# Patient Record
Sex: Female | Born: 1935
Health system: Southern US, Community
[De-identification: ages and names within clinical notes are randomized; demographics above are authoritative.]

## PROBLEM LIST (undated history)

## (undated) DIAGNOSIS — Z9889 Other specified postprocedural states: Secondary | ICD-10-CM

## (undated) DIAGNOSIS — I2510 Atherosclerotic heart disease of native coronary artery without angina pectoris: Secondary | ICD-10-CM

## (undated) DIAGNOSIS — I10 Essential (primary) hypertension: Secondary | ICD-10-CM

## (undated) DIAGNOSIS — E785 Hyperlipidemia, unspecified: Secondary | ICD-10-CM

## (undated) HISTORY — DX: Atherosclerotic heart disease of native coronary artery without angina pectoris: I25.10

## (undated) HISTORY — DX: Other specified postprocedural states: Z98.890

## (undated) HISTORY — PX: CORONARY ANGIOPLASTY WITH STENT PLACEMENT: SHX49

## (undated) HISTORY — DX: Hyperlipidemia, unspecified: E78.5

## (undated) HISTORY — DX: Essential (primary) hypertension: I10

## (undated) HISTORY — PX: CAROTID ENDARTERECTOMY: SUR193

---

## 1997-12-08 ENCOUNTER — Other Ambulatory Visit: Admission: RE | Admit: 1997-12-08 | Discharge: 1997-12-08 | Payer: Self-pay | Admitting: Family Medicine

## 1998-05-29 ENCOUNTER — Ambulatory Visit (HOSPITAL_COMMUNITY): Admission: RE | Admit: 1998-05-29 | Discharge: 1998-05-29 | Payer: Self-pay | Admitting: Urology

## 1998-07-04 ENCOUNTER — Ambulatory Visit (HOSPITAL_COMMUNITY): Admission: RE | Admit: 1998-07-04 | Discharge: 1998-07-04 | Payer: Self-pay | Admitting: Family Medicine

## 1998-07-04 ENCOUNTER — Encounter: Payer: Self-pay | Admitting: Family Medicine

## 1999-08-03 ENCOUNTER — Encounter (INDEPENDENT_AMBULATORY_CARE_PROVIDER_SITE_OTHER): Payer: Self-pay | Admitting: Specialist

## 1999-08-03 ENCOUNTER — Ambulatory Visit (HOSPITAL_COMMUNITY): Admission: RE | Admit: 1999-08-03 | Discharge: 1999-08-03 | Payer: Self-pay | Admitting: Urology

## 1999-12-09 ENCOUNTER — Other Ambulatory Visit: Admission: RE | Admit: 1999-12-09 | Discharge: 1999-12-09 | Payer: Self-pay | Admitting: Family Medicine

## 2000-07-05 ENCOUNTER — Encounter: Payer: Self-pay | Admitting: Emergency Medicine

## 2000-07-05 ENCOUNTER — Emergency Department (HOSPITAL_COMMUNITY): Admission: EM | Admit: 2000-07-05 | Discharge: 2000-07-05 | Payer: Self-pay | Admitting: Emergency Medicine

## 2000-07-21 ENCOUNTER — Ambulatory Visit (HOSPITAL_COMMUNITY): Admission: RE | Admit: 2000-07-21 | Discharge: 2000-07-22 | Payer: Self-pay | Admitting: Cardiovascular Disease

## 2000-08-28 ENCOUNTER — Other Ambulatory Visit: Admission: RE | Admit: 2000-08-28 | Discharge: 2000-08-28 | Payer: Self-pay | Admitting: Urology

## 2001-12-17 ENCOUNTER — Other Ambulatory Visit: Admission: RE | Admit: 2001-12-17 | Discharge: 2001-12-17 | Payer: Self-pay | Admitting: Family Medicine

## 2002-04-18 ENCOUNTER — Ambulatory Visit (HOSPITAL_COMMUNITY): Admission: RE | Admit: 2002-04-18 | Discharge: 2002-04-18 | Payer: Self-pay | Admitting: *Deleted

## 2004-02-02 ENCOUNTER — Encounter: Admission: RE | Admit: 2004-02-02 | Discharge: 2004-02-02 | Payer: Self-pay | Admitting: Emergency Medicine

## 2004-02-11 ENCOUNTER — Other Ambulatory Visit: Admission: RE | Admit: 2004-02-11 | Discharge: 2004-02-11 | Payer: Self-pay | Admitting: Family Medicine

## 2004-10-13 ENCOUNTER — Encounter: Admission: RE | Admit: 2004-10-13 | Discharge: 2004-10-13 | Payer: Self-pay | Admitting: Family Medicine

## 2005-02-10 LAB — TSH: TSH: 1.33 u[IU]/mL (ref <=5.90)

## 2006-09-29 ENCOUNTER — Ambulatory Visit: Payer: Self-pay | Admitting: Vascular Surgery

## 2006-10-24 ENCOUNTER — Encounter: Payer: Self-pay | Admitting: Neurosurgery

## 2006-11-01 ENCOUNTER — Other Ambulatory Visit: Admission: RE | Admit: 2006-11-01 | Discharge: 2006-11-01 | Payer: Self-pay | Admitting: Family Medicine

## 2006-11-21 ENCOUNTER — Inpatient Hospital Stay (HOSPITAL_COMMUNITY): Admission: RE | Admit: 2006-11-21 | Discharge: 2006-11-22 | Payer: Self-pay | Admitting: Interventional Radiology

## 2007-03-30 ENCOUNTER — Ambulatory Visit (HOSPITAL_COMMUNITY): Admission: RE | Admit: 2007-03-30 | Discharge: 2007-03-30 | Payer: Self-pay | Admitting: Interventional Radiology

## 2007-11-26 ENCOUNTER — Ambulatory Visit (HOSPITAL_COMMUNITY): Admission: RE | Admit: 2007-11-26 | Discharge: 2007-11-26 | Payer: Self-pay | Admitting: Interventional Radiology

## 2008-07-01 ENCOUNTER — Encounter: Payer: Self-pay | Admitting: Interventional Radiology

## 2008-07-04 ENCOUNTER — Ambulatory Visit (HOSPITAL_COMMUNITY): Admission: RE | Admit: 2008-07-04 | Discharge: 2008-07-04 | Payer: Self-pay | Admitting: Interventional Radiology

## 2009-05-04 ENCOUNTER — Ambulatory Visit (HOSPITAL_COMMUNITY): Admission: RE | Admit: 2009-05-04 | Discharge: 2009-05-04 | Payer: Self-pay | Admitting: Interventional Radiology

## 2009-08-11 ENCOUNTER — Ambulatory Visit: Payer: Self-pay | Admitting: Cardiovascular Disease

## 2009-08-11 LAB — BASIC METABOLIC PANEL
BUN: 20 mg/dL (ref 4–21)
Creatinine: 0.7 mg/dL (ref <=1.1)

## 2009-08-11 LAB — LIPID PANEL
Cholesterol: 131 mg/dL (ref 0–200)
HDL: 57 mg/dL (ref 35–70)
LDL Cholesterol: 58 mg/dL
Triglycerides: 80 mg/dL (ref 40–160)

## 2010-01-20 ENCOUNTER — Encounter: Payer: Self-pay | Admitting: Cardiovascular Disease

## 2010-01-20 DIAGNOSIS — Z9889 Other specified postprocedural states: Secondary | ICD-10-CM | POA: Insufficient documentation

## 2010-01-20 DIAGNOSIS — I2510 Atherosclerotic heart disease of native coronary artery without angina pectoris: Secondary | ICD-10-CM | POA: Insufficient documentation

## 2010-01-20 DIAGNOSIS — I10 Essential (primary) hypertension: Secondary | ICD-10-CM | POA: Insufficient documentation

## 2010-01-20 DIAGNOSIS — E785 Hyperlipidemia, unspecified: Secondary | ICD-10-CM | POA: Insufficient documentation

## 2010-01-24 ENCOUNTER — Encounter: Payer: Self-pay | Admitting: Interventional Radiology

## 2010-02-10 ENCOUNTER — Encounter: Payer: Self-pay | Admitting: Cardiovascular Disease

## 2010-02-10 ENCOUNTER — Ambulatory Visit (INDEPENDENT_AMBULATORY_CARE_PROVIDER_SITE_OTHER): Payer: Medicare Other | Admitting: Cardiovascular Disease

## 2010-02-10 VITALS — BP 140/60 | Wt 146.0 lb

## 2010-02-10 DIAGNOSIS — Z9861 Coronary angioplasty status: Secondary | ICD-10-CM

## 2010-02-10 DIAGNOSIS — E785 Hyperlipidemia, unspecified: Secondary | ICD-10-CM

## 2010-02-10 DIAGNOSIS — I119 Hypertensive heart disease without heart failure: Secondary | ICD-10-CM

## 2010-02-10 DIAGNOSIS — I10 Essential (primary) hypertension: Secondary | ICD-10-CM

## 2010-02-10 DIAGNOSIS — I2510 Atherosclerotic heart disease of native coronary artery without angina pectoris: Secondary | ICD-10-CM

## 2010-02-10 MED ORDER — NEBIVOLOL HCL 5 MG PO TABS: 5.0000 mg | ORAL_TABLET | Freq: Every day | ORAL | Status: DC

## 2010-02-10 NOTE — Progress Notes (Signed)
Patient Active Problem List  Diagnoses  . Coronary artery disease  . Hypertension  . Hyperlipemia  . S/P carotid endarterectomy    Current Outpatient Prescriptions on File Prior to Visit  Medication Sig Dispense Refill  . alendronate (FOSAMAX) 70 MG tablet Take 70 mg by mouth every 7 (seven) days. Take in the morning with a full glass of water, on an empty stomach, and do not take anything else by mouth or lie down for the next 30 min.       Marland Kitchen amLODipine (NORVASC) 5 MG tablet Take 5 mg by mouth daily.        Marland Kitchen aspirin 325 MG EC tablet Take 325 mg by mouth daily.        . hydrochlorothiazide 25 MG tablet Take 25 mg by mouth daily.        Marland Kitchen lisinopril (PRINIVIL,ZESTRIL) 40 MG tablet Take 40 mg by mouth daily.        . potassium chloride (KLOR-CON) 10 MEQ CR tablet Take 10 mEq by mouth daily.        . rosuvastatin (CRESTOR) 10 MG tablet Take 10 mg by mouth daily.          Allergies  Allergen Reactions  . Codeine     [name] is an elderly female in no acute distress. The patient is alert and oriented x 3.  The mood and affect are normal.  Wt. is 146.  BP is 140/60.  HR is 80.  The HEENT exam reveals that the mucous membranes are moist.  The carotids are normal without bruits.  She has a right carotid endarterectomy scar There is no thyromegaly or JVD.  The lungs are clear.  The chest wall is non tender.  The heart exam reveals a regular rate with a normal S1 and S2.  There are no murmurs and the PMI is not displaced.  Abdominal exam reveals good bowel sounds.   There is no hepatosplenomegaly or tenderness.  There are no masses.  Exam of the legs reveal no clubbing, cyanosis, or edema.  The legs are without rashes.  The distal pulses are intact.  Cranial nerves II - XII are intact.  Motor and sensory functions are intact.  The gait is normal.

## 2010-02-10 NOTE — Patient Instructions (Signed)
She is to call if she has any further problems with her blood pressure.

## 2010-02-10 NOTE — Assessment & Plan Note (Signed)
She is very stable from a coronary standpoint. She has not had any episodes of angina

## 2010-02-10 NOTE — Assessment & Plan Note (Signed)
Her blood pressure is mildly elevated today. We will add Bystolic 5 mg a day. I've given her samples. I'll see her again in one to 2 months for followup visit.

## 2010-02-10 NOTE — Assessment & Plan Note (Signed)
Her lipid levels have been stable. We'll check her lipid profile and a CMET at her next visit.

## 2010-02-11 ENCOUNTER — Other Ambulatory Visit (INDEPENDENT_AMBULATORY_CARE_PROVIDER_SITE_OTHER): Payer: Medicare Other

## 2010-02-11 DIAGNOSIS — E7800 Pure hypercholesterolemia, unspecified: Secondary | ICD-10-CM

## 2010-02-11 DIAGNOSIS — Z79899 Other long term (current) drug therapy: Secondary | ICD-10-CM

## 2010-03-23 LAB — CREATININE, SERUM
Creatinine, Ser: 0.71 mg/dL (ref 0.4–1.2)
GFR calc Af Amer: >60 mL/min (ref >60)
GFR calc non Af Amer: >60 mL/min (ref >60)

## 2010-03-23 LAB — BUN: BUN: 21 mg/dL (ref 6–23)

## 2010-04-02 ENCOUNTER — Ambulatory Visit (INDEPENDENT_AMBULATORY_CARE_PROVIDER_SITE_OTHER): Payer: Medicare Other | Admitting: Cardiovascular Disease

## 2010-04-02 ENCOUNTER — Encounter: Payer: Self-pay | Admitting: Cardiovascular Disease

## 2010-04-02 VITALS — BP 134/56 | HR 80 | Wt 148.0 lb

## 2010-04-02 DIAGNOSIS — E785 Hyperlipidemia, unspecified: Secondary | ICD-10-CM

## 2010-04-02 DIAGNOSIS — T148XXA Other injury of unspecified body region, initial encounter: Secondary | ICD-10-CM | POA: Insufficient documentation

## 2010-04-02 DIAGNOSIS — I10 Essential (primary) hypertension: Secondary | ICD-10-CM

## 2010-04-02 LAB — CBC WITH DIFFERENTIAL/PLATELET
Basophils Absolute: 0 10*3/uL (ref 0.0–0.1)
Basophils Relative: 0.4 % (ref 0.0–3.0)
Eosinophils Absolute: 0.1 10*3/uL (ref 0.0–0.7)
Eosinophils Relative: 1.6 % (ref 0.0–5.0)
HCT: 38.8 % (ref 36.0–46.0)
Hemoglobin: 13.4 g/dL (ref 12.0–15.0)
Lymphocytes Relative: 13.3 % (ref 12.0–46.0)
Lymphs Abs: 1.2 10*3/uL (ref 0.7–4.0)
MCHC: 34.5 g/dL (ref 30.0–36.0)
MCV: 91.4 fl (ref 78.0–100.0)
Monocytes Absolute: 0.6 10*3/uL (ref 0.1–1.0)
Monocytes Relative: 6 % (ref 3.0–12.0)
Neutro Abs: 7.3 10*3/uL (ref 1.4–7.7)
Neutrophils Relative %: 78.7 % — ABNORMAL HIGH (ref 43.0–77.0)
Platelets: 204 10*3/uL (ref 150.0–400.0)
RBC: 4.25 Mil/uL (ref 3.87–5.11)
RDW: 13.1 % (ref 11.5–14.6)
WBC: 9.3 10*3/uL (ref 4.5–10.5)

## 2010-04-02 NOTE — Assessment & Plan Note (Signed)
She's complaining of bruising. She also has a conjunctival hemorrhage of the right eye. We'll check a CBC with differential today.

## 2010-04-02 NOTE — Assessment & Plan Note (Signed)
[name] seems to be doing very well. Her blood pressure is still very well controlled. She'll continue to stay off the Bystolic if suspect that she has an allergy to that medication. We'll continue to follow this closely.

## 2010-04-02 NOTE — Progress Notes (Signed)
History of Present Illness: [name] is doing very well. She has not had any episodes of chest pain or shortness of breath.Her blood pressure  was elevated when I last saw him to start her on Bystolic. She developed mouth ulcers and mouth sores so the Bystolic was discontinued.  Current Outpatient Prescriptions on File Prior to Visit  Medication Sig Dispense Refill  . alendronate (FOSAMAX) 70 MG tablet Take 70 mg by mouth every 7 (seven) days. Take in the morning with a full glass of water, on an empty stomach, and do not take anything else by mouth or lie down for the next 30 min.       Marland Kitchen amLODipine (NORVASC) 5 MG tablet Take 5 mg by mouth daily.        Marland Kitchen aspirin 325 MG EC tablet Take 325 mg by mouth daily.        . hydrochlorothiazide 25 MG tablet Take 25 mg by mouth daily.        Marland Kitchen lisinopril (PRINIVIL,ZESTRIL) 40 MG tablet Take 40 mg by mouth daily.        . potassium chloride (KLOR-CON) 10 MEQ CR tablet Take 10 mEq by mouth daily.        . rosuvastatin (CRESTOR) 10 MG tablet Take 10 mg by mouth daily.        Marland Kitchen DISCONTD: nebivolol (BYSTOLIC) 5 MG tablet Take 1 tablet (5 mg total) by mouth daily.  30 tablet  12    Allergies  Allergen Reactions  . Codeine     Past Medical History  Diagnosis Date  . Coronary artery disease   . Hypertension   . Hyperlipemia   . S/P carotid endarterectomy     RIGHT CAROTID    Past Surgical History  Procedure Date  . Carotid endarterectomy   . Coronary angioplasty with stent placement     FIRST DIAGONAL    History  Smoking status  . Never Smoker   Smokeless tobacco  . Not on file    History  Alcohol Use No    Family History  Problem Relation Age of Onset  . Stroke Father     DECEASED  . Stroke Mother     DECEASED  . Hypertension Mother     DECEASED  . Stroke Brother     Reviw of Systems:  The patient denies any heat or cold intolerance.  No weight gain or weight loss.  The patient denies headaches or blurry vision.  There is no  cough or sputum production.  The patient denies dizziness.  There is no hematuria or hematochezia.  The patient denies any muscle aches or arthritis.  The patient denies any rash.  The patient denies frequent falling or instability.  There is no history of depression or anxiety.  All other systems were reviewed and are negative.  Physical Exam: BP 134/56  Pulse 80  Wt 148 lb (67.132 kg) The patient is alert and oriented x 3.  The mood and affect are normal.  The skin is warm and dry.  Color is normal.  The HEENT exam reveals that She has a slight conjunctival hemorrhage around her right eye..  The mucous membranes are moist.  The carotids are 2+ without bruits.  There is no thyromegaly.  There is no JVD.  The lungs are clear.  The chest wall is non tender.  The heart exam reveals a regular rate with a normal S1 and S2.  There are no murmurs, gallops, or rubs.  The PMI is not displaced.   Abdominal exam reveals good bowel sounds.  There is no guarding or rebound.  There is no hepatosplenomegaly or tenderness.  There are no masses.  Exam of the legs reveal no clubbing, cyanosis, or edema.  The legs are without rashes.  The distal pulses are intact.  Cranial nerves II - XII are intact.  Motor and sensory functions are intact.  The gait is normal.  Assessment / Plan:

## 2010-04-02 NOTE — Assessment & Plan Note (Signed)
We'll check a lipid profile, basic metabolic profile, and hepatic profile will see her again in 6 months.

## 2010-04-06 ENCOUNTER — Telehealth: Payer: Self-pay | Admitting: Cardiovascular Disease

## 2010-04-06 NOTE — Telephone Encounter (Signed)
Normal lab results mailed to pt. Alfonso Ramus RN

## 2010-04-11 LAB — CBC
HCT: 41.7 % (ref 36.0–46.0)
Hemoglobin: 14.3 g/dL (ref 12.0–15.0)
MCHC: 34.3 g/dL (ref 30.0–36.0)
MCV: 90.4 fL (ref 78.0–100.0)
Platelets: 178 10*3/uL (ref 150–400)
RBC: 4.62 MIL/uL (ref 3.87–5.11)
RDW: 12.9 % (ref 11.5–15.5)
WBC: 7 10*3/uL (ref 4.0–10.5)

## 2010-04-11 LAB — BASIC METABOLIC PANEL
BUN: 21 mg/dL (ref 6–23)
CO2: 26 mEq/L (ref 19–32)
Calcium: 9.5 mg/dL (ref 8.4–10.5)
Chloride: 105 mEq/L (ref 96–112)
Creatinine, Ser: 0.59 mg/dL (ref 0.4–1.2)
GFR calc Af Amer: >60 mL/min (ref >60)
GFR calc non Af Amer: >60 mL/min (ref >60)
Glucose, Bld: 95 mg/dL (ref 70–99)
Potassium: 4 mEq/L (ref 3.5–5.1)
Sodium: 139 mEq/L (ref 135–145)

## 2010-04-11 LAB — APTT: aPTT: 35 seconds (ref 24–37)

## 2010-04-11 LAB — PROTIME-INR
INR: 1.1 (ref 0.00–1.49)
Prothrombin Time: 14.3 seconds (ref 11.6–15.2)

## 2010-05-18 NOTE — Procedures (Signed)
CAROTID DUPLEX EXAM   INDICATION:  Follow up known carotid artery disease.   HISTORY:  Diabetes:  No.  Cardiac:  PTCA.  Hypertension:  Yes.  Smoking:  No.  Previous Surgery:  Right carotid endarterectomy in 1994.  CV History:  No.  Amaurosis Fugax No, Paresthesias No, Hemiparesis No                                       RIGHT             LEFT  Brachial systolic pressure:         124               110  Brachial Doppler waveforms:         Biphasic          Biphasic  Vertebral direction of flow:        Antegrade         Antegrade  DUPLEX VELOCITIES (cm/sec)  CCA peak systolic                   96                118  ECA peak systolic                   158               136  ICA peak systolic                   136               106  ICA end diastolic                   40                33  PLAQUE MORPHOLOGY:                  Heterogenous      Heterogenous  PLAQUE AMOUNT:                      Mild              Mild  PLAQUE LOCATION:                    ICA, ECA          ICA, ECA   IMPRESSION:  1. A 1-39% stenosis noted in bilateral internal carotid arteries,      status post right carotid      endarterectomy.  2. Antegrade bilateral vertebral arteries.   ___________________________________________  Larina Earthly, M.D.   MG/MEDQ  D:  09/29/2006  T:  09/30/2006  Job:  [id number]
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
## 2010-05-18 NOTE — H&P (Signed)
NAMEQUINNIE, BARCELO                   ACCOUNT NO.:  [id number]   MEDICAL RECORD NO.:  [id number]          PATIENT TYPE:  INP   LOCATION:  2899                         FACILITY:  [facility]   PHYSICIAN:  Sanjeev K. Deveshwar, M.D.DATE OF BIRTH:  1930-07-12   DATE OF ADMISSION:  11/21/2006  DATE OF DISCHARGE:                              HISTORY & PHYSICAL   CHIEF COMPLAINT:  Cerebral aneurysm.   HISTORY OF PRESENT ILLNESS:  This is a very pleasant, 77 year old female  who was referred to Dr. Corliss Skains through the courtesy of Dr. Trey Sailors  for further evaluation of a cerebral aneurysm. The the patient was  having an odd sensation in her right forehead along with numbness in her  right foot in early September of this year. An MRA was performed on  September 26, 2006 that revealed a 4 x 5 mm aneurysm in the left  anterior cerebral artery. The patient saw Dr. Channing Mutters and then was referred  to Dr. Corliss Skains who saw the patient in consultation on October 24, 2006. Treatment options were discussed including conservative  management, open craniotomy and endovascular treatment. The risks and  benefits of each option were discussed as well. The patient and her  husband decided to proceed with a cerebral angiogram and possible  endovascular treatment. She is admitted to [facility] today  October 24, 2006 for those procedures.   PAST MEDICAL HISTORY:  Significant for hyperlipidemia and hypertension.  She has a history of coronary artery disease followed by Dr. Leodis Sias. She had a cardiac catheterization in July 2002 with a PTA of the  first diagonal. Her ejection fraction was estimated to be 65-70% at that  time. She recently saw Dr. Elease Hashimoto on November 07, 2006 and was cleared to  undergo anesthesia and treatment of the aneurysm.   PAST SURGICAL HISTORY:  Significant for a right carotid endarterectomy  in approximately 1990. She also is status post rotator cuff repair. She  has had bunion  surgery, she denies previous problems with anesthesia.   ALLERGIES:  The patient is intolerant to CODEINE. She does not remember  the reaction.   CURRENT MEDICATIONS:  Aspirin, Plavix which was started 3 days ago in  anticipation of possible stenting, Crestor, amlodipine,  hydrochlorothiazide, potassium, Ecotrin, Xanax, Plavix and lisinopril.   SOCIAL HISTORY:  The patient is married, they have 3 children. One of  their daughters is mentally retarded. The patient lives in [place]  with her husband. She has never smoked. She uses alcohol rarely. She  retired in 2001. 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   FAMILY HISTORY:  Her mother died at age 86 from a stroke. She also had  Parkinson's disease. Her father died at age 19 from a stroke. She had a  brother who survived a ruptured aneurysm. She has a very strong family  history of strokes.   REVIEW OF SYSTEMS:  Completely negative except for recent nausea which  attributes to antibiotics, specifically Cipro which she has been taking  for a urinary tract infection. She is  on day #4 or 5 of her course of  Cipro. She also had a history of arthritis in her knees. The remainder  of the review of systems is negative.   LABORATORY DATA:  INR is 1.1, PTT was 35. Hemoglobin 14.2, hematocrit  42.2, WBC 7.5000. Platelets 247,000, BUN is 14, creatinine 0.86.  Potassium was 4.3, glucose 109. GFR was greater than 60.   PHYSICAL EXAMINATION:  Reveals a pleasant but anxious, 77 year old,  white female in no acute distress.  VITAL SIGNS:  Blood pressure 117/63, pulse 82, respirations 18,  temperature 97.2. Oxygen saturation 95% on room air.  HEENT:  Unremarkable.  NECK:  Revealed no bruits.  HEART:  Revealed regular rate and rhythm without murmur.  LUNGS:  Clear.  ABDOMEN:  Soft, nontender.  EXTREMITIES:  Revealed pulses to be intact without edema.  Her airway was rated at a 1, her ASA scale was a 3.  NEUROLOGIC:  Revealed the  patient to be alert and oriented and following  commands. Cranial nerves II-XII are grossly intact. Sensation is intact  to light touch. Motor strength is 5/5 throughout. Cerebellar testing was  intact.   IMPRESSION:  1. 4 x 5 mm aneurysm of the left anterior cerebral artery by MRA      performed September 26, 2006.  2. History of hyperlipidemia.  3. History of hypertension.  4. History of coronary artery disease with a remote PTA of the first      diagonal performed by Dr. Elease Hashimoto.  5. Ejection fraction estimated to be 65-70%.  6. Status post right carotid endarterectomy in 1990.  7. Intolerance to CODEINE.  8. History of an odd feeling in her right forehead as well as numbness      of her right foot in early September which prompted the MRA.   PLAN:  As noted, the patient will undergo a cerebral angiogram today to  be performed by Dr. Corliss Skains. She will also undergo endovascular  treatment of her cerebral aneurysm with possible coiling and/or stenting  if felt to be safe and indicated. This will be performed under general  anesthesia.      Delton See, P.A.    ______________________________  Grandville Silos. Corliss Skains, M.D.    DR/MEDQ  D:  11/21/2006  T:  11/21/2006  Job:  [id number]   cc:   Payton Doughty, M.D.  Duncan Dull, M.D.  Vesta Mixer, M.D.

## 2010-05-18 NOTE — Consult Note (Signed)
NAMEARICKA, [name]                   ACCOUNT NO.:  [id number]   MEDICAL RECORD NO.:  [id number]          PATIENT TYPE:  OUT   LOCATION:  XRAY                         FACILITY:  [facility]   PHYSICIAN:  Sanjeev K. Deveshwar, M.D.DATE OF BIRTH:  [birth year]   DATE OF CONSULTATION:  07/01/2008  DATE OF DISCHARGE:  07/01/2008                                 CONSULTATION   DATE OF FOLLOWUP VISIT:  July 01, 2008.   CHIEF COMPLAINT:  Status post left pericallosal aneurysm coiling  performed, November 21, 2006.   HISTORY:  This is a very pleasant 77 year old female who was referred to  Dr. Corliss Skains through the courtesy of Dr. Trey Sailors in 2008 for  evaluation of the cerebral aneurysm.  An MRA performed on September 26, 2006, had revealed a 4 x 5-mm aneurysm of the left pericallosal artery.  The patient was seen in consultation by Dr. Corliss Skains in October 2008.  Treatment options were discussed along with potential risks and  benefits.  The patient and her husband made a decision to proceed with  coiling of the aneurysm which was performed on November 21, 2006.  The  patient returns today with some new concerns.  She is accompanied by her  husband.   PAST MEDICAL HISTORY:  Significant for:  1. Hyperlipidemia.  2. Hypertension.  3. Coronary artery disease, followed by Dr. Kristeen Miss.  The      patient had a cardiac catheterization in July 2002.  She had an      angioplasty of the first diagonal.  She had an ejection fraction      noted to be 65-70%.  The patient sees Dr. Elease Hashimoto about every 6      months.   PAST SURGICAL HISTORY:  Significant for right carotid endarterectomy in  the 1990s.  She also has had a rotator cuff repair.  She has had bunion  surgery.  She denies any previous problems with anesthesia.   ALLERGIES:  She is intolerant to CODEINE.   CURRENT MEDICATIONS:  1. Aspirin 325 mg daily.  2. Amlodipine 5 mg daily.  3. Lisinopril 40 mg daily.  4. Crestor 10 mg daily.  5.  Hydrochlorothiazide 25 mg daily.  6. Potassium chloride 10 mEq daily.  7. Alendronate 70 mg each week.  8. Calcium 600 mg daily.   SOCIAL HISTORY:  The patient is married.  She and her husband have 3  children, one daughter is mentally disabled.  The patient lives in  [place] with her husband.  She has never smoked.  She uses alcohol  rarely.  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   FAMILY HISTORY:  Her mother died at age 12 from a stroke.  She also had  Parkinson disease.  Her father died at age 23 from a stroke.  She had a  brother who survived a ruptured aneurysm.  She has a very strong family  history of strokes.   IMPRESSION AND PLAN:  The patient returns today to be seen in followup  by Dr. Corliss Skains.  She had undergone coiling of the left pericallosal  aneurysm on November 21, 2006.  She had a followup angiogram on November 26, 2007.  At which time, her aneurysm appeared to be stable.  Dr.  Corliss Skains did recommend a followup angiogram in 6 months which would  have been May of this year.  However, the angiogram apparently was never  scheduled.   The patient requested a followup visit today for new symptoms.  Approximately 2 weeks ago, she developed a funny feeling in the right  side of her head as well as numbness on the bottom of her foot.  She had  similar symptoms just prior to her aneurysm being diagnosed in 2008.  She is concerned that possibly something has happen to the aneurysm or  perhaps she has developed a new aneurysm.   Dr. Corliss Skains did review the images from the previous coiling with the  patient and her husband.  He felt that there was a very good result from  the coiling procedure.  Since the patient is currently overdue for a  followup angiogram, a decision was made to proceed with a cerebral  angiogram this week to further evaluate the previously coiled aneurysm  and to help evaluate the recent onset of symptoms.  Dr.  Corliss Skains felt  that if the angiogram does not show any new findings, she could consider  having an MRI performed, although he felt an angiogram was the  recommended diagnostic procedure at this time.  We have scheduled her  for this coming Friday at 11:00 a.m.  All of their questions were  answered.  Greater than 15 minutes was spent on this followup visit.      Delton See, P.A.    ______________________________  Grandville Silos. Corliss Skains, M.D.    DR/MEDQ  D:  07/01/2008  T:  07/02/2008  Job:  [id number]   cc:   Duncan Dull, M.D.  Payton Doughty, M.D.
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
## 2010-05-21 NOTE — Discharge Summary (Signed)
[name], [name]                   ACCOUNT NO.:  [id number]   MEDICAL RECORD NO.:  [id number]          PATIENT TYPE:  INP   LOCATION:  3106                         FACILITY:  [facility]   PHYSICIAN:  Sanjeev K. 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   HISTORY OF PRESENT ILLNESS:  This is a pleasant 77 year old female  referred to Dr. Corliss Skains through the courtesy of Dr. Trey Sailors for  evaluation of a cerebral aneurysm. Please see admission history and  physical for full details.  An MRA was performed on September 26, 2006  that revealed a 4 x 5 mm aneurysm in the left anterior cerebral artery.  The patient was seen by Dr. Channing Mutters and referred to Dr. Corliss Skains who saw  the patient in consultation on October 24, 2006 treatment options were  discussed along with potential risks and benefits. Arrangements were  made to admit the patient [facility] on November 21, 2006 for  intervention.   PAST MEDICAL HISTORY:  Significant for hyperlipidemia, hypertension,  coronary artery disease followed by Dr. Kristeen Miss.  The patient had  a cardiac catheterization July 2002 with a PTA of the first diagonal.  Her ejection fraction of 65-70% at that time she saw Dr. Elease Hashimoto in  November 2008 and was cleared undergo anesthesia and treatment of the  aneurysm.   PAST SURGICAL HISTORY:  Significant for right carotid endarterectomy she  believes in 30.  She is also status post  rotator cuff repair.  She  has had bunion surgery.  She denies previous problems with anesthesia.   ALLERGIES:  She is intolerant to CODEINE.   SOCIAL HISTORY:  The patient is married.  They have three children, one  daughter is mentally retarded the patient lives in [place] with her  husband.  She has never smoked.  She  uses alcohol rarely.  She retired  in 2001.  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  She also had  Parkinson's disease.  Her father died at age 60 from stroke.  She had a  brother who survived a ruptured aneurysm.  She has a very strong family  history of strokes.   HOSPITAL COURSE:  As noted this patient was admitted to [facility] on November 21, 2006 for further evaluation and possible  treatment of a cerebral aneurysm.  She did undergo a cerebral angiogram  just after admission.  She was found to have a 5.8 mm x 4.8 mm in the  left pericallosal region. The patient subsequently underwent coiling of  the aneurysm performed by Dr. Corliss Skains under general anesthesia.  There  were no immediate or known complications.  Please see Dr. Fatima Sanger  complete dictated note for full details.   Following the procedure the patient was admitted to the neuro intensive  care unit where she was monitored on IV heparin overnight.  The  following day the heparin was discontinued.  Her groin sheath was  removed.  The patient remained on bedrest for 6 hours.  Her potassium  was noted to be low at 3.3, this was supplemented.  Her hemoglobin was  mildly low at 11.7 with a hematocrit of 33.5.   Following 6 hours of bedrest the patient was ambulated with assistance.  She appeared to be doing fine and arrangements were made to discharge  the family the patient in improved and stable condition.   DISCHARGE MEDICATIONS:  The patient was told to resume all of her  previous home medications which included:  Aspirin 325 mg daily.  Crestor,  amlodipine,  hydrochlorothiazide, potassium supplement,  Xanax  and lisinopril.  Plavix had been started prior to admission in  anticipation of possible stent placement. However, this was discontinued  prior to discharge from the hospital.   The patient was given instructions regarding wound care.   She was told  to stay on her regular diet.  She was not to drive or do anything  strenuous for 2 weeks.  A follow-up angiogram was recommended in  approximately 3 months.  The patient was to see Dr. Corliss Skains on follow-  up December 11, 2006 at 2:30 p.m. she was told to follow up with her  primary care physician, Dr. Shaune Pollack for a repeat potassium level  within several weeks.   FINAL DIAGNOSIS:  1. A 5.8 mm x 4.8 mm left pericallosal aneurysm, status post coiling      performed November 21, 2006 by Dr. Corliss Skains.  2. History of hyperlipidemia.  3. History of hypertension.  4. Coronary artery disease with remote PTA of the first diagonal.  5. Ejection fraction 65-70%.  6. Status post right carotid endarterectomy in 1990.  7. Intolerance to CODEINE.  8. Hypokalemia, supplemented.  9. Mild anemia.      Delton See, P.A.    ______________________________  Grandville Silos. Corliss Skains, M.D.    DR/MEDQ  D:  01/22/2007  T:  01/23/2007  Job:  [id number]   cc:   Payton Doughty, M.D.  Duncan Dull, M.D.  Vesta Mixer, M.D.

## 2010-06-10 ENCOUNTER — Encounter: Payer: Self-pay | Admitting: Cardiovascular Disease

## 2010-06-16 ENCOUNTER — Other Ambulatory Visit (HOSPITAL_COMMUNITY): Payer: Self-pay | Admitting: Interventional Radiology

## 2010-06-16 DIAGNOSIS — I729 Aneurysm of unspecified site: Secondary | ICD-10-CM

## 2010-06-24 ENCOUNTER — Telehealth: Payer: Self-pay | Admitting: Cardiovascular Disease

## 2010-06-24 ENCOUNTER — Other Ambulatory Visit: Payer: Self-pay | Admitting: *Deleted

## 2010-06-24 ENCOUNTER — Ambulatory Visit (HOSPITAL_COMMUNITY)
Admission: RE | Admit: 2010-06-24 | Discharge: 2010-06-24 | Disposition: A | Discharge status: Home / Self Care | Payer: Medicare Other | Source: Ambulatory Visit | Attending: Interventional Radiology | Admitting: Interventional Radiology

## 2010-06-24 DIAGNOSIS — I671 Cerebral aneurysm, nonruptured: Secondary | ICD-10-CM | POA: Insufficient documentation

## 2010-06-24 DIAGNOSIS — I729 Aneurysm of unspecified site: Secondary | ICD-10-CM

## 2010-06-24 DIAGNOSIS — R51 Headache: Secondary | ICD-10-CM | POA: Insufficient documentation

## 2010-06-24 LAB — CREATININE, SERUM
Creatinine, Ser: 0.67 mg/dL (ref 0.50–1.10)
GFR calc Af Amer: >60 mL/min (ref >60)
GFR calc non Af Amer: >60 mL/min (ref >60)

## 2010-06-24 LAB — BUN: BUN: 18 mg/dL (ref 6–23)

## 2010-06-24 MED ORDER — ROSUVASTATIN CALCIUM 20 MG PO TABS: ORAL_TABLET | ORAL | Status: DC

## 2010-06-24 MED ORDER — IOHEXOL 350 MG/ML SOLN
50.0000 mL | Freq: Once | INTRAVENOUS | Status: AC | PRN
  Administered 2010-06-24: 50 mL via INTRAVENOUS

## 2010-06-24 NOTE — Progress Notes (Signed)
Verified pt taking crestor 20mg  daily, samples provided for one month

## 2010-06-24 NOTE — Telephone Encounter (Signed)
Called in wanting to know if we have any samples of Crestor 40mg  that she can have. Please call back. I have pulled the chart.

## 2010-06-30 ENCOUNTER — Other Ambulatory Visit (HOSPITAL_COMMUNITY): Payer: Self-pay | Admitting: Interventional Radiology

## 2010-06-30 DIAGNOSIS — I671 Cerebral aneurysm, nonruptured: Secondary | ICD-10-CM

## 2010-07-27 ENCOUNTER — Ambulatory Visit (HOSPITAL_COMMUNITY): Admission: RE | Admit: 2010-07-27 | Payer: Medicare Other | Source: Ambulatory Visit

## 2010-07-30 ENCOUNTER — Other Ambulatory Visit: Payer: Self-pay | Admitting: Cardiovascular Disease

## 2010-07-30 NOTE — Telephone Encounter (Signed)
Fax received from pharmacy. Refill completed. Jodette Jontavius Rabalais RN  

## 2010-08-02 ENCOUNTER — Telehealth: Payer: Self-pay | Admitting: Cardiovascular Disease

## 2010-08-02 MED ORDER — LISINOPRIL 40 MG PO TABS: 40.0000 mg | ORAL_TABLET | Freq: Every day | ORAL | Status: DC

## 2010-08-02 NOTE — Telephone Encounter (Signed)
Pt called and states [facility] is waiting on Dr. Elease Hashimoto for script for refill on 4 medications, pt is following up due to having only one week's worth left, pt states their fax # is [phone]

## 2010-08-02 NOTE — Telephone Encounter (Signed)
Called requesting refills on amlodipine,HCTZ,KCL and lisinopril. Informed all but lisinopril was refilled on 7/27. Will send Lisinopril in today.

## 2010-08-27 ENCOUNTER — Other Ambulatory Visit: Payer: Self-pay | Admitting: Cardiovascular Disease

## 2010-08-27 NOTE — Telephone Encounter (Signed)
Spoke with Larah's spouse and confirmed crestor strength, reminded we need to set up a fasting lab draw, orders are in echrt, he will remind her, refill completed.Alfonso Ramus RN

## 2010-09-27 LAB — BASIC METABOLIC PANEL
BUN: 19
CO2: 24
Calcium: 9.2
Chloride: 104
Creatinine, Ser: 0.63
GFR calc Af Amer: >60
GFR calc non Af Amer: >60
Glucose, Bld: 105 — ABNORMAL HIGH
Potassium: 3.9
Sodium: 135

## 2010-09-27 LAB — CBC
HCT: 39.9
Hemoglobin: 13.6
MCHC: 34.2
MCV: 87.6
Platelets: 189
RBC: 4.55
RDW: 13.8
WBC: 6.3

## 2010-09-27 LAB — APTT: aPTT: 35

## 2010-09-27 LAB — PROTIME-INR
INR: 1.1
Prothrombin Time: 14.7

## 2010-10-01 ENCOUNTER — Other Ambulatory Visit (INDEPENDENT_AMBULATORY_CARE_PROVIDER_SITE_OTHER): Payer: Medicare Other | Admitting: *Deleted

## 2010-10-01 DIAGNOSIS — E785 Hyperlipidemia, unspecified: Secondary | ICD-10-CM

## 2010-10-01 LAB — LIPID PANEL
Cholesterol: 120 mg/dL (ref 0–200)
HDL: 61.6 mg/dL (ref >39.00)
LDL Cholesterol: 52 mg/dL (ref 0–99)
Total CHOL/HDL Ratio: 2
Triglycerides: 32 mg/dL (ref 0.0–149.0)
VLDL: 6.4 mg/dL (ref 0.0–40.0)

## 2010-10-01 LAB — BASIC METABOLIC PANEL
BUN: 19 mg/dL (ref 6–23)
CO2: 29 mEq/L (ref 19–32)
Calcium: 9.6 mg/dL (ref 8.4–10.5)
Chloride: 105 mEq/L (ref 96–112)
Creatinine, Ser: 0.6 mg/dL (ref 0.4–1.2)
GFR: 100.68 mL/min (ref >60.00)
Glucose, Bld: 96 mg/dL (ref 70–99)
Potassium: 4.9 mEq/L (ref 3.5–5.1)
Sodium: 142 mEq/L (ref 135–145)

## 2010-10-01 LAB — HEPATIC FUNCTION PANEL
ALT: 16 U/L (ref 0–35)
AST: 26 U/L (ref 0–37)
Albumin: 3.8 g/dL (ref 3.5–5.2)
Alkaline Phosphatase: 38 U/L — ABNORMAL LOW (ref 39–117)
Bilirubin, Direct: 0.1 mg/dL (ref 0.0–0.3)
Total Bilirubin: 0.6 mg/dL (ref 0.3–1.2)
Total Protein: 6.9 g/dL (ref 6.0–8.3)

## 2010-10-05 ENCOUNTER — Ambulatory Visit (INDEPENDENT_AMBULATORY_CARE_PROVIDER_SITE_OTHER): Payer: Medicare Other | Admitting: Cardiovascular Disease

## 2010-10-05 ENCOUNTER — Encounter: Payer: Self-pay | Admitting: Cardiovascular Disease

## 2010-10-05 VITALS — BP 99/57 | HR 76 | Ht 65.0 in | Wt 148.0 lb

## 2010-10-05 DIAGNOSIS — I10 Essential (primary) hypertension: Secondary | ICD-10-CM

## 2010-10-05 DIAGNOSIS — E785 Hyperlipidemia, unspecified: Secondary | ICD-10-CM

## 2010-10-05 DIAGNOSIS — I2510 Atherosclerotic heart disease of native coronary artery without angina pectoris: Secondary | ICD-10-CM

## 2010-10-05 LAB — BASIC METABOLIC PANEL
BUN: 8
CO2: 26
Calcium: 8.8
Chloride: 101
Creatinine, Ser: 0.57
GFR calc Af Amer: >60
GFR calc non Af Amer: >60
Glucose, Bld: 102 — ABNORMAL HIGH
Potassium: 3.8
Sodium: 135

## 2010-10-05 LAB — PROTIME-INR
INR: 1.2
Prothrombin Time: 15.7 — ABNORMAL HIGH

## 2010-10-05 LAB — CBC
HCT: 38.9
Hemoglobin: 13.4
MCHC: 34.3
MCV: 91.1
Platelets: 191
RBC: 4.28
RDW: 12.3
WBC: 6.8

## 2010-10-05 NOTE — Assessment & Plan Note (Signed)
Her blood pressure is a little bit low. At this point she's not having any symptoms of orthostatic hypotension. I've asked her to measure her blood pressure at least once a week. If she continues to have episodes of low blood pressure we will decrease her amlodipine or perhaps her HCTZ. For now we'll continue with her same medications.

## 2010-10-05 NOTE — Assessment & Plan Note (Signed)
She's very stable from a coronary standpoint. She's not had any episodes of chest pain or shortness breath.

## 2010-10-05 NOTE — Progress Notes (Signed)
[name] Date of Birth  1930-03-26 [facility] [address]    [address] [place], [place]  [address] [phone]  Fax  [phone]  History of Present Illness:  [name] is a 77 year old female with a history of coronary artery disease. She is  status post PTCA and cutting balloon procedure to her first diagonal artery. She also has a history of hypertension, hyperlipidemia and right carotid endarterectomy.  She denies any episodes of chest pain or shortness of breath.  Current Outpatient Prescriptions on File Prior to Visit  Medication Sig Dispense Refill  . alendronate (FOSAMAX) 70 MG tablet Take 70 mg by mouth every 7 (seven) days. Take in the morning with a full glass of water, on an empty stomach, and do not take anything else by mouth or lie down for the next 30 min.       Marland Kitchen amLODipine (NORVASC) 5 MG tablet TAKE 1 TABLET DAILY  90 tablet  1  . aspirin 325 MG EC tablet Take 325 mg by mouth daily.        . CRESTOR 40 MG tablet TAKE ONE TABLET BY MOUTH EVERY DAY  30 each  5  . hydrochlorothiazide 25 MG tablet TAKE 1 TABLET DAILY  90 tablet  1  . lisinopril (PRINIVIL,ZESTRIL) 40 MG tablet Take 1 tablet (40 mg total) by mouth daily.  90 tablet  3  . potassium chloride (MICRO-K) 10 MEQ CR capsule TAKE 1 CAPSULE DAILY  90 capsule  1    Allergies  Allergen Reactions  . Bystolic (Nebivolol Hcl) Other (See Comments)    Mouth sores  . Codeine     Past Medical History  Diagnosis Date  . Coronary artery disease   . Hypertension   . Hyperlipemia   . S/P carotid endarterectomy     RIGHT CAROTID    Past Surgical History  Procedure Date  . Carotid endarterectomy   . Coronary angioplasty with stent placement     FIRST DIAGONAL    History  Smoking status  . Never Smoker   Smokeless tobacco  . Not on file    History  Alcohol Use No    Family History  Problem Relation Age of Onset  . Stroke Father     DECEASED  . Stroke Mother     DECEASED  . Hypertension  Mother     DECEASED  . Stroke Brother     Reviw of Systems:  Reviewed in the HPI.  All other systems are negative.  Physical Exam: BP 99/57  Pulse 76  Ht 5\' 5"  (1.651 m)  Wt 148 lb (67.132 kg)  BMI 24.63 kg/m2 The patient is alert and oriented x 3.  The mood and affect are normal.   Skin: warm and dry.  Color is normal.    HEENT:   the sclera are nonicteric.  The mucous membranes are moist.  The carotids are 2+ without bruits.  There is no thyromegaly.  There is no JVD.    Lungs: clear.  The chest wall is non tender.    Heart: regular rate with a normal S1 and S2.  There are no murmurs, gallops, or rubs. The PMI is not displaced.     Abdomen: good bowel sounds.  There is no guarding or rebound.  There is no hepatosplenomegaly or tenderness.  There are no masses.   Extremities:  no clubbing, cyanosis, or edema.  The legs are without rashes.  The distal pulses are intact.  Neuro:  Cranial nerves II - XII are intact.  Motor and sensory functions are intact.    The gait is normal.   Assessment / Plan:

## 2010-10-05 NOTE — Patient Instructions (Addendum)
Your physician wants you to follow-up in:6 months You will receive a reminder letter in the mail two months in advance. If you don't receive a letter, please call our office to schedule the follow-up appointment.  Your physician recommends that you continue on your current medications as directed. Please refer to the Current Medication list given to you today.  Your physician recommends that you return for lab work in: 6 months/ fasting lipids, bmet/ hfp

## 2010-10-12 LAB — APTT
aPTT: 179 — ABNORMAL HIGH
aPTT: 75 — ABNORMAL HIGH
aPTT: 35

## 2010-10-12 LAB — DIFFERENTIAL
Basophils Absolute: 0
Basophils Relative: 0
Eosinophils Absolute: 0.1 — ABNORMAL LOW
Eosinophils Relative: 2
Lymphocytes Relative: 19
Lymphs Abs: 1.4
Monocytes Absolute: 0.7
Monocytes Relative: 9
Neutro Abs: 5.3
Neutrophils Relative %: 70

## 2010-10-12 LAB — BASIC METABOLIC PANEL
BUN: 8
BUN: 14
CO2: 23
CO2: 26
Calcium: 8.4
Calcium: 9.4
Chloride: 109
Chloride: 103
Creatinine, Ser: 0.62
Creatinine, Ser: 0.86
GFR calc Af Amer: >60
GFR calc Af Amer: >60
GFR calc non Af Amer: >60
GFR calc non Af Amer: >60
Glucose, Bld: 105 — ABNORMAL HIGH
Glucose, Bld: 109 — ABNORMAL HIGH
Potassium: 3.3 — ABNORMAL LOW
Potassium: 4.3
Sodium: 137
Sodium: 137

## 2010-10-12 LAB — CBC
HCT: 33.5 — ABNORMAL LOW
HCT: 42.2
Hemoglobin: 11.6 — ABNORMAL LOW
Hemoglobin: 14.2
MCHC: 34.6
MCHC: 33.7
MCV: 88.6
MCV: 89.4
Platelets: 185
Platelets: 247
RBC: 3.78 — ABNORMAL LOW
RBC: 4.71
RDW: 13
RDW: 12.4
WBC: 6.2
WBC: 7.5

## 2010-10-12 LAB — PROTIME-INR
INR: 1.3
INR: 1.1
Prothrombin Time: 16 — ABNORMAL HIGH
Prothrombin Time: 14.7

## 2010-11-22 ENCOUNTER — Telehealth: Payer: Self-pay | Admitting: Cardiovascular Disease

## 2010-11-22 MED ORDER — AMLODIPINE BESYLATE 5 MG PO TABS: 2.5000 mg | ORAL_TABLET | Freq: Every day | ORAL | Status: DC

## 2010-11-22 NOTE — Telephone Encounter (Signed)
Please have her decrease her Amlodipine to 2.5 mg daily.  If her BP is still low after 3 days of the lower dose, she should DC the Amlodipine completely.

## 2010-11-22 NOTE — Telephone Encounter (Signed)
Pt informed and verbalized understanding. She will call and update office with her bp readings on Monday 11-29-10

## 2010-11-22 NOTE — Telephone Encounter (Signed)
Pt calling stating that diastolic BP stays in the 50's most of the time. Pt also c/o felling lightheaded most of the time. Pt stated she is going out of town on Wednesday and wants to know if it is suggested pt come in to see MD or what course of action pt should follow. Please return pt call to discuss further.

## 2010-11-22 NOTE — Telephone Encounter (Signed)
bp average 107/55 c/o lightheadedness worse in morning but is persistent through day. HR 82 average. Please advise.

## 2010-12-01 ENCOUNTER — Telehealth: Payer: Self-pay | Admitting: Cardiovascular Disease

## 2010-12-01 NOTE — Telephone Encounter (Signed)
Pt was taken off norvasc, bp not going up, was 90/50 yesterday, pls advise

## 2010-12-01 NOTE — Telephone Encounter (Signed)
Pt now off norvasc due to hypotension, yesterday bp 90/50, today 126/54 pt feels ok but is concerned. Wants to know if further f/u is needed like a nuclear stress test? Please advise

## 2010-12-02 ENCOUNTER — Other Ambulatory Visit: Payer: Self-pay | Admitting: *Deleted

## 2010-12-02 NOTE — Telephone Encounter (Signed)
Low BP is not necessarily an indication for stress test.  Is she having any cp.  If so, she needs an OV

## 2010-12-02 NOTE — Telephone Encounter (Signed)
PT TO CALL WITH FURTHER HYPOTENSION ISSUES SO MEDS CAN BE FURTHER ADJUSTED

## 2010-12-17 ENCOUNTER — Telehealth: Payer: Self-pay | Admitting: Cardiovascular Disease

## 2010-12-17 NOTE — Telephone Encounter (Signed)
Pt called and informed samples at desk

## 2010-12-17 NOTE — Telephone Encounter (Signed)
New Msg: Pt calling wanting to know if we have any crestor samples. Pt takes 40 mg tablets and cuts them in half to make 20 mg tablets. Please return pt call to discuss further.

## 2011-01-18 ENCOUNTER — Other Ambulatory Visit: Payer: Self-pay | Admitting: *Deleted

## 2011-01-18 MED ORDER — HYDROCHLOROTHIAZIDE 25 MG PO TABS: 25.0000 mg | ORAL_TABLET | Freq: Every day | ORAL | Status: DC

## 2011-01-18 MED ORDER — POTASSIUM CHLORIDE ER 10 MEQ PO CPCR: 10.0000 meq | ORAL_CAPSULE | Freq: Every day | ORAL | Status: DC

## 2011-01-18 NOTE — Telephone Encounter (Signed)
Fax Received. Refill Completed. [name] (R.M.A)   

## 2011-01-20 ENCOUNTER — Other Ambulatory Visit: Payer: Self-pay | Admitting: *Deleted

## 2011-01-20 ENCOUNTER — Other Ambulatory Visit: Payer: Self-pay | Admitting: Cardiovascular Disease

## 2011-01-20 ENCOUNTER — Telehealth: Payer: Self-pay | Admitting: Cardiovascular Disease

## 2011-01-20 NOTE — Telephone Encounter (Signed)
Error

## 2011-01-20 NOTE — Telephone Encounter (Signed)
Called and spoke to pt about both rx's and told her they went sent to correct pharmacy on 01/18/2011. Pt will call back if rx's not received.

## 2011-01-20 NOTE — Telephone Encounter (Signed)
Refill F/U  Patient said she called earlier today for refill  On   hydrochlorothiazide (HYDRODIURIL) 25 MG tablet POTASSIUM CHLORIDE ER 10 MEQ PO CPCR  Prescription was sent to wrong place, need to go to [facility] which is list on the file   Patient is low on medication. Please return call to patient at hm#

## 2011-01-27 ENCOUNTER — Other Ambulatory Visit: Payer: Self-pay | Admitting: Cardiovascular Disease

## 2011-01-27 NOTE — Telephone Encounter (Signed)
[facility]ed [facility], they thought there must be a glitch on there side but it looks ok now and script will go through, pt informed

## 2011-01-27 NOTE — Telephone Encounter (Signed)
New msg: pt stating that for some reason [facility] has pt RX's on hold and stated they will not release them until April 03, 2011 unless we call [facility] to discuss issue with them. Please return pt call to inform when [facility] has been contacted and RX's have been ordered.    [facility] Doctor Call #:  [phone]

## 2011-02-15 ENCOUNTER — Telehealth: Payer: Self-pay | Admitting: Cardiovascular Disease

## 2011-02-15 NOTE — Telephone Encounter (Signed)
New msg Pt wants to talk to you about assistance program for crestor Please call her back

## 2011-03-16 ENCOUNTER — Other Ambulatory Visit: Payer: Self-pay | Admitting: *Deleted

## 2011-03-16 MED ORDER — ROSUVASTATIN CALCIUM 20 MG PO TABS: 20.0000 mg | ORAL_TABLET | Freq: Every day | ORAL | Status: DC

## 2011-03-16 NOTE — Progress Notes (Signed)
Called pt for update to chart//soc #, also she stated she is on crestor 20 mg daily, chart MAR changed to reflect.

## 2011-03-31 ENCOUNTER — Ambulatory Visit: Payer: Medicare Other | Admitting: Cardiovascular Disease

## 2011-04-05 ENCOUNTER — Ambulatory Visit: Payer: Medicare Other | Admitting: Cardiovascular Disease

## 2011-04-15 ENCOUNTER — Other Ambulatory Visit (INDEPENDENT_AMBULATORY_CARE_PROVIDER_SITE_OTHER): Payer: Medicare Other

## 2011-04-15 ENCOUNTER — Encounter: Payer: Self-pay | Admitting: Cardiovascular Disease

## 2011-04-15 ENCOUNTER — Ambulatory Visit (INDEPENDENT_AMBULATORY_CARE_PROVIDER_SITE_OTHER): Payer: Medicare Other | Admitting: Cardiovascular Disease

## 2011-04-15 VITALS — BP 130/61 | HR 65 | Resp 18 | Ht 64.0 in | Wt 138.8 lb

## 2011-04-15 DIAGNOSIS — I10 Essential (primary) hypertension: Secondary | ICD-10-CM

## 2011-04-15 DIAGNOSIS — I2510 Atherosclerotic heart disease of native coronary artery without angina pectoris: Secondary | ICD-10-CM

## 2011-04-15 DIAGNOSIS — E785 Hyperlipidemia, unspecified: Secondary | ICD-10-CM

## 2011-04-15 LAB — HEPATIC FUNCTION PANEL
ALT: 14 U/L (ref 0–35)
AST: 23 U/L (ref 0–37)
Albumin: 4.2 g/dL (ref 3.5–5.2)
Alkaline Phosphatase: 35 U/L — ABNORMAL LOW (ref 39–117)
Bilirubin, Direct: 0.1 mg/dL (ref 0.0–0.3)
Total Bilirubin: 0.4 mg/dL (ref 0.3–1.2)
Total Protein: 7 g/dL (ref 6.0–8.3)

## 2011-04-15 LAB — BASIC METABOLIC PANEL
BUN: 20 mg/dL (ref 6–23)
CO2: 29 mEq/L (ref 19–32)
Calcium: 9.5 mg/dL (ref 8.4–10.5)
Chloride: 102 mEq/L (ref 96–112)
Creatinine, Ser: 0.7 mg/dL (ref 0.4–1.2)
GFR: 91.81 mL/min (ref >60.00)
Glucose, Bld: 100 mg/dL — ABNORMAL HIGH (ref 70–99)
Potassium: 3.9 mEq/L (ref 3.5–5.1)
Sodium: 138 mEq/L (ref 135–145)

## 2011-04-15 LAB — LIPID PANEL
Cholesterol: 132 mg/dL (ref 0–200)
HDL: 61.4 mg/dL (ref >39.00)
LDL Cholesterol: 60 mg/dL (ref 0–99)
Total CHOL/HDL Ratio: 2
Triglycerides: 54 mg/dL (ref 0.0–149.0)
VLDL: 10.8 mg/dL (ref 0.0–40.0)

## 2011-04-15 MED ORDER — LISINOPRIL 40 MG PO TABS: 40.0000 mg | ORAL_TABLET | Freq: Every day | ORAL | Status: DC

## 2011-04-15 NOTE — Progress Notes (Signed)
[name] Date of Birth  1930/08/09 [facility] [address]    [address] [place], [place]  [address] [phone]  Fax  [phone]  Problem list: 1. Coronary artery disease-status post PTCA cutting balloon to the first diagonal 2. Hypertension 3. Hyperlipidemia 1 peripheral vascular disease-status post right carotid endarterectomy  History of Present Illness:  [name] is a 78 year old female with a history of coronary artery disease. She is  status post PTCA and cutting balloon procedure to her first diagonal artery. She also has a history of hypertension, hyperlipidemia and right carotid endarterectomy.  She denies any episodes of chest pain or shortness of breath.  She has been losing weight and has not been trying to diet.  Current Outpatient Prescriptions on File Prior to Visit  Medication Sig Dispense Refill  . alendronate (FOSAMAX) 70 MG tablet Take 70 mg by mouth every 7 (seven) days. Take in the morning with a full glass of water, on an empty stomach, and do not take anything else by mouth or lie down for the next 30 min.       Marland Kitchen aspirin 325 MG EC tablet Take 325 mg by mouth daily.        . Calcium Carbonate-Vitamin D (CALTRATE 600+D PO) Take by mouth daily.        . hydrochlorothiazide (HYDRODIURIL) 25 MG tablet Take 1 tablet (25 mg total) by mouth daily.  90 tablet  3  . lisinopril (PRINIVIL,ZESTRIL) 40 MG tablet Take 1 tablet (40 mg total) by mouth daily.  90 tablet  3  . potassium chloride (MICRO-K) 10 MEQ CR capsule Take 1 capsule (10 mEq total) by mouth daily.  90 capsule  3  . rosuvastatin (CRESTOR) 20 MG tablet Take 1 tablet (20 mg total) by mouth daily.        Allergies  Allergen Reactions  . Bystolic (Nebivolol Hcl) Other (See Comments)    Mouth sores  . Codeine     Past Medical History  Diagnosis Date  . Coronary artery disease   . Hypertension   . Hyperlipemia   . S/P carotid endarterectomy     RIGHT CAROTID    Past Surgical History    Procedure Date  . Carotid endarterectomy   . Coronary angioplasty with stent placement     FIRST DIAGONAL    History  Smoking status  . Never Smoker   Smokeless tobacco  . Not on file    History  Alcohol Use No    Family History  Problem Relation Age of Onset  . Stroke Father     DECEASED  . Stroke Mother     DECEASED  . Hypertension Mother     DECEASED  . Stroke Brother     Reviw of Systems:  Reviewed in the HPI.  All other systems are negative.  Physical Exam: BP 130/61  Pulse 65  Resp 18  Ht 5\' 4"  (1.626 m)  Wt 138 lb 12.8 oz (62.959 kg)  BMI 23.82 kg/m2 The patient is alert and oriented x 3.  The mood and affect are normal.   Skin: warm and dry.  Color is normal.    HEENT:   the sclera are nonicteric.  The mucous membranes are moist.  The carotids are 2+ without bruits.  There is no thyromegaly.  There is no JVD.    Lungs: clear.  The chest wall is non tender.    Heart: regular rate with a normal S1 and S2.  There  are no murmurs, gallops, or rubs. The PMI is not displaced.     Abdomen: good bowel sounds.  There is no guarding or rebound.  There is no hepatosplenomegaly or tenderness.  There are no masses.   Extremities:  no clubbing, cyanosis, or edema.  The legs are without rashes.  The distal pulses are intact.   Neuro:  Cranial nerves II - XII are intact.  Motor and sensory functions are intact.    The gait is normal.  ECG: 04/15/2011-sinus bradycardia. No ST or T wave changes.  Assessment / Plan:

## 2011-04-15 NOTE — Assessment & Plan Note (Signed)
[name] is doing well. She's not having any episodes of chest pain or shortness of breath. We'll check fasting laboratory data and again in 6 months.  She complains of losing some weight. She's lost weight and has not been dieting. Will have her address this with Dr. Kevan Ny.

## 2011-04-15 NOTE — Patient Instructions (Signed)
Your physician wants you to follow-up in: 6 months with Dr. Elease Hashimoto.  You will receive a reminder letter in the mail two months in advance. If you don't receive a letter, please call our office to schedule the follow-up appointment.  Your physician recommends that you return for lab work in: 6 months on the day of your appointment.

## 2011-04-20 NOTE — Progress Notes (Signed)
Addended by: Reine Just on: 04/20/2011 11:07 AM   Modules accepted: Orders

## 2011-06-06 ENCOUNTER — Other Ambulatory Visit (HOSPITAL_COMMUNITY): Payer: Self-pay | Admitting: Interventional Radiology

## 2011-06-06 ENCOUNTER — Telehealth (HOSPITAL_COMMUNITY): Payer: Self-pay

## 2011-06-06 DIAGNOSIS — I729 Aneurysm of unspecified site: Secondary | ICD-10-CM

## 2011-06-06 NOTE — Telephone Encounter (Signed)
I left a vm in regards to the f/u CT for Mrs. [name]

## 2011-06-14 ENCOUNTER — Telehealth (HOSPITAL_COMMUNITY): Payer: Self-pay

## 2011-06-14 NOTE — Telephone Encounter (Signed)
Spoke with [name].  I gave her the appt date and time.  She will be here.

## 2011-06-14 NOTE — Telephone Encounter (Signed)
I called [name] to try to schedule her f/u ct angio.  I left a message.  She returned the call but, I was on vacation.  I returned her phone call on 06-14-11 and spoke with her husband.  He will have her call me back.

## 2011-06-23 ENCOUNTER — Ambulatory Visit (HOSPITAL_COMMUNITY)
Admission: RE | Admit: 2011-06-23 | Discharge: 2011-06-23 | Disposition: A | Discharge status: Home / Self Care | Payer: Medicare Other | Source: Ambulatory Visit | Attending: Interventional Radiology | Admitting: Interventional Radiology

## 2011-06-23 DIAGNOSIS — E785 Hyperlipidemia, unspecified: Secondary | ICD-10-CM | POA: Insufficient documentation

## 2011-06-23 DIAGNOSIS — I10 Essential (primary) hypertension: Secondary | ICD-10-CM | POA: Insufficient documentation

## 2011-06-23 DIAGNOSIS — I729 Aneurysm of unspecified site: Secondary | ICD-10-CM | POA: Insufficient documentation

## 2011-06-23 LAB — BUN: BUN: 20 mg/dL (ref 6–23)

## 2011-06-23 LAB — CREATININE, SERUM
Creatinine, Ser: 0.67 mg/dL (ref 0.50–1.10)
GFR calc Af Amer: >90 mL/min (ref >90)
GFR calc non Af Amer: 81 mL/min — ABNORMAL LOW (ref >90)

## 2011-06-23 MED ORDER — IOHEXOL 350 MG/ML SOLN
50.0000 mL | Freq: Once | INTRAVENOUS | Status: AC | PRN
  Administered 2011-06-23: 50 mL via INTRAVENOUS

## 2011-07-01 ENCOUNTER — Other Ambulatory Visit: Payer: Self-pay | Admitting: Cardiovascular Disease

## 2011-07-01 MED ORDER — LISINOPRIL 40 MG PO TABS: 40.0000 mg | ORAL_TABLET | Freq: Every day | ORAL | Status: DC

## 2011-07-04 ENCOUNTER — Other Ambulatory Visit: Payer: Self-pay | Admitting: *Deleted

## 2011-07-04 NOTE — Telephone Encounter (Signed)
Opened in Error.

## 2011-07-14 ENCOUNTER — Other Ambulatory Visit: Payer: Self-pay | Admitting: *Deleted

## 2011-07-14 ENCOUNTER — Other Ambulatory Visit: Payer: Self-pay | Admitting: Cardiovascular Disease

## 2011-07-14 MED ORDER — LISINOPRIL 40 MG PO TABS: 40.0000 mg | ORAL_TABLET | Freq: Every day | ORAL | Status: DC

## 2011-07-14 NOTE — Telephone Encounter (Signed)
Refilled lisinopril

## 2011-07-14 NOTE — Telephone Encounter (Signed)
Fax Received. Refill Completed. [name] (R.M.A)   

## 2011-07-15 ENCOUNTER — Telehealth: Payer: Self-pay | Admitting: Cardiovascular Disease

## 2011-07-15 NOTE — Telephone Encounter (Signed)
New msg Pt was calling about lisinopril. She wants to talk to you about rx request going to [facility]

## 2011-07-15 NOTE — Telephone Encounter (Signed)
lmtcb

## 2011-07-15 NOTE — Telephone Encounter (Signed)
[phone] pt rtn call

## 2011-07-15 NOTE — Telephone Encounter (Signed)
Pt said [facility] didn't have script when she called this am, told her it was sent yesterday and sometimes it takes a day or two to get through their system, pt will f/u with them.

## 2011-07-19 ENCOUNTER — Telehealth (HOSPITAL_COMMUNITY): Payer: Self-pay

## 2011-07-19 ENCOUNTER — Other Ambulatory Visit: Payer: Self-pay | Admitting: Cardiovascular Disease

## 2011-07-19 MED ORDER — LISINOPRIL 40 MG PO TABS: 40.0000 mg | ORAL_TABLET | Freq: Every day | ORAL | Status: DC

## 2011-07-19 NOTE — Telephone Encounter (Signed)
I called [name] to let her know that Dr. Corliss Skains stated that her next f/u will need to be in 2 years which will be a CTA of the Head

## 2011-07-20 MED ORDER — LISINOPRIL 40 MG PO TABS: 40.0000 mg | ORAL_TABLET | Freq: Every day | ORAL | Status: DC

## 2011-07-20 NOTE — Addendum Note (Signed)
Addended by: Early Chars on: 07/20/2011 09:46 AM   Modules accepted: Orders

## 2011-10-14 ENCOUNTER — Other Ambulatory Visit: Payer: Medicare Other

## 2011-10-18 ENCOUNTER — Other Ambulatory Visit (INDEPENDENT_AMBULATORY_CARE_PROVIDER_SITE_OTHER): Payer: Medicare Other

## 2011-10-18 ENCOUNTER — Other Ambulatory Visit: Payer: Self-pay | Admitting: *Deleted

## 2011-10-18 DIAGNOSIS — E785 Hyperlipidemia, unspecified: Secondary | ICD-10-CM

## 2011-10-18 LAB — BASIC METABOLIC PANEL
BUN: 18 mg/dL (ref 6–23)
CO2: 29 mEq/L (ref 19–32)
Calcium: 9.4 mg/dL (ref 8.4–10.5)
Chloride: 101 mEq/L (ref 96–112)
Creatinine, Ser: 0.7 mg/dL (ref 0.4–1.2)
GFR: 81.62 mL/min (ref >60.00)
Glucose, Bld: 88 mg/dL (ref 70–99)
Potassium: 4.3 mEq/L (ref 3.5–5.1)
Sodium: 137 mEq/L (ref 135–145)

## 2011-10-18 LAB — HEPATIC FUNCTION PANEL
ALT: 17 U/L (ref 0–35)
AST: 26 U/L (ref 0–37)
Albumin: 3.6 g/dL (ref 3.5–5.2)
Alkaline Phosphatase: 32 U/L — ABNORMAL LOW (ref 39–117)
Bilirubin, Direct: 0.1 mg/dL (ref 0.0–0.3)
Total Bilirubin: 0.8 mg/dL (ref 0.3–1.2)
Total Protein: 7 g/dL (ref 6.0–8.3)

## 2011-10-18 LAB — LIPID PANEL
Cholesterol: 125 mg/dL (ref 0–200)
HDL: 57.8 mg/dL
LDL Cholesterol: 58 mg/dL (ref 0–99)
Total CHOL/HDL Ratio: 2
Triglycerides: 46 mg/dL (ref 0.0–149.0)
VLDL: 9.2 mg/dL (ref 0.0–40.0)

## 2011-10-18 NOTE — Progress Notes (Signed)
Pt came early for fasting labs/ order placed.

## 2011-10-28 ENCOUNTER — Ambulatory Visit: Payer: Medicare Other | Admitting: Cardiovascular Disease

## 2011-11-14 ENCOUNTER — Telehealth: Payer: Self-pay | Admitting: Cardiovascular Disease

## 2011-11-14 NOTE — Telephone Encounter (Signed)
Walk in pt Form " Pt Needs Refills forms Completed" sent to [facility] 11/14/11/Km

## 2011-11-15 ENCOUNTER — Ambulatory Visit (INDEPENDENT_AMBULATORY_CARE_PROVIDER_SITE_OTHER): Payer: Medicare Other | Admitting: Cardiovascular Disease

## 2011-11-15 ENCOUNTER — Encounter: Payer: Self-pay | Admitting: Cardiovascular Disease

## 2011-11-15 VITALS — BP 116/66 | HR 78 | Ht 64.0 in | Wt 138.0 lb

## 2011-11-15 DIAGNOSIS — I2510 Atherosclerotic heart disease of native coronary artery without angina pectoris: Secondary | ICD-10-CM

## 2011-11-15 DIAGNOSIS — E785 Hyperlipidemia, unspecified: Secondary | ICD-10-CM

## 2011-11-15 MED ORDER — ASPIRIN EC 81 MG PO TBEC: 325.0000 mg | DELAYED_RELEASE_TABLET | Freq: Every day | ORAL | Status: DC

## 2011-11-15 NOTE — Assessment & Plan Note (Signed)
We'll check fasting lipids on her at her next visit. Last labs were normal.

## 2011-11-15 NOTE — Progress Notes (Signed)
[name] Date of Birth  1930/12/17 [facility] [address]    [address] [place], [place]  [address] [phone]  Fax  [phone]  Problem list: 1. Coronary artery disease-status post PTCA cutting balloon to the first diagonal 2. Hypertension 3. Hyperlipidemia 4.  peripheral vascular disease-status post right carotid endarterectomy   History of Present Illness:  [name] is a 78 year old female with a history of coronary artery disease. She is  status post PTCA and cutting balloon procedure to her first diagonal artery. She also has a history of hypertension, hyperlipidemia and right carotid endarterectomy.  She denies any episodes of chest pain or shortness of breath.   She has had an occasional episode of lightheadedness.   She has not had any angina.  She complains of cold feet at night.  Current Outpatient Prescriptions on File Prior to Visit  Medication Sig Dispense Refill  . alendronate (FOSAMAX) 70 MG tablet Take 70 mg by mouth every 7 (seven) days. Take in the morning with a full glass of water, on an empty stomach, and do not take anything else by mouth or lie down for the next 30 min.       Marland Kitchen ALPRAZolam (XANAX) 0.5 MG tablet Take 0.5 mg by mouth at bedtime as needed. 1-2 prn      . aspirin 325 MG EC tablet Take 325 mg by mouth daily.        . Calcium Carbonate-Vitamin D (CALTRATE 600+D PO) Take by mouth daily.        . hydrochlorothiazide (HYDRODIURIL) 25 MG tablet Take 1 tablet (25 mg total) by mouth daily.  90 tablet  3  . lisinopril (PRINIVIL,ZESTRIL) 40 MG tablet Take 1 tablet (40 mg total) by mouth daily.  90 tablet  3  . potassium chloride (MICRO-K) 10 MEQ CR capsule Take 1 capsule (10 mEq total) by mouth daily.  90 capsule  3  . rosuvastatin (CRESTOR) 20 MG tablet Take 1 tablet (20 mg total) by mouth daily.      . valACYclovir (VALTREX) 1000 MG tablet Take 1,000 mg by mouth as needed.         Allergies  Allergen Reactions  . Bystolic (Nebivolol Hcl) Other  (See Comments)    Mouth sores  . Codeine     Past Medical History  Diagnosis Date  . Coronary artery disease   . Hypertension   . Hyperlipemia   . S/P carotid endarterectomy     RIGHT CAROTID    Past Surgical History  Procedure Date  . Carotid endarterectomy   . Coronary angioplasty with stent placement     FIRST DIAGONAL    History  Smoking status  . Never Smoker   Smokeless tobacco  . Not on file    History  Alcohol Use No    Family History  Problem Relation Age of Onset  . Stroke Father     DECEASED  . Stroke Mother     DECEASED  . Hypertension Mother     DECEASED  . Stroke Brother     Reviw of Systems:  Reviewed in the HPI.  All other systems are negative.  Physical Exam: BP 116/66  Pulse 78  Ht 5\' 4"  (1.626 m)  Wt 138 lb (62.596 kg)  BMI 23.69 kg/m2  SpO2 97% The patient is alert and oriented x 3.  The mood and affect are normal.   Skin: warm and dry.  Color is normal.    HEENT:  the sclera are nonicteric.  The mucous membranes are moist.  The carotids are 2+ without bruits.  There is no thyromegaly.  There is no JVD.    Lungs: clear.  The chest wall is non tender.    Heart: regular rate with a normal S1 and S2.  There are no murmurs, gallops, or rubs. The PMI is not displaced.     Abdomen: good bowel sounds.  There is no guarding or rebound.  There is no hepatosplenomegaly or tenderness.  There are no masses.   Extremities:  no clubbing, cyanosis, or edema.  The legs are without rashes.  The distal pulses are intact.   Neuro:  Cranial nerves II - XII are intact.  Motor and sensory functions are intact.    The gait is normal.  ECG: 04/15/2011-sinus bradycardia. No ST or T wave changes.  Assessment / Plan:

## 2011-11-15 NOTE — Assessment & Plan Note (Signed)
[name] is doing well. She's not having any angina pain. She's had some indigestion but seems to be related to esophageal reflux.  We will reduce her aspirin 81 mg a day which should help her reflux. I've asked her to call me if she has any angina. I've asked her to exercise on a regular basis.

## 2011-11-15 NOTE — Patient Instructions (Addendum)
Your physician has recommended you make the following change in your medication: LOWER DOSE OF ASPIRIN TO 81 MG DAILY  Your physician wants you to follow-up in: 6 MONTHS  You will receive a reminder letter in the mail two months in advance. If you don't receive a letter, please call our office to schedule the follow-up appointment.  Your physician recommends that you return for a FASTING lipid profile: 6 MONTHS

## 2012-02-20 ENCOUNTER — Other Ambulatory Visit: Payer: Self-pay | Admitting: Family Medicine

## 2012-02-20 ENCOUNTER — Ambulatory Visit
Admission: RE | Admit: 2012-02-20 | Discharge: 2012-02-20 | Disposition: A | Discharge status: Home / Self Care | Payer: Medicare Other | Source: Ambulatory Visit | Attending: Family Medicine | Admitting: Family Medicine

## 2012-02-20 DIAGNOSIS — R200 Anesthesia of skin: Secondary | ICD-10-CM

## 2012-02-20 DIAGNOSIS — R42 Dizziness and giddiness: Secondary | ICD-10-CM

## 2012-02-20 MED ORDER — GADOBENATE DIMEGLUMINE 529 MG/ML IV SOLN
13.0000 mL | Freq: Once | INTRAVENOUS | Status: AC | PRN
  Administered 2012-02-20: 13 mL via INTRAVENOUS

## 2012-06-01 ENCOUNTER — Encounter: Payer: Self-pay | Admitting: Cardiovascular Disease

## 2012-06-01 ENCOUNTER — Ambulatory Visit (INDEPENDENT_AMBULATORY_CARE_PROVIDER_SITE_OTHER): Payer: Medicare Other | Admitting: Cardiovascular Disease

## 2012-06-01 ENCOUNTER — Encounter: Payer: Self-pay | Admitting: *Deleted

## 2012-06-01 ENCOUNTER — Other Ambulatory Visit: Payer: Self-pay | Admitting: *Deleted

## 2012-06-01 ENCOUNTER — Encounter (INDEPENDENT_AMBULATORY_CARE_PROVIDER_SITE_OTHER): Payer: Medicare Other

## 2012-06-01 VITALS — BP 122/71 | HR 64 | Ht 64.0 in | Wt 136.0 lb

## 2012-06-01 DIAGNOSIS — E785 Hyperlipidemia, unspecified: Secondary | ICD-10-CM

## 2012-06-01 DIAGNOSIS — I2510 Atherosclerotic heart disease of native coronary artery without angina pectoris: Secondary | ICD-10-CM

## 2012-06-01 DIAGNOSIS — Z9889 Other specified postprocedural states: Secondary | ICD-10-CM

## 2012-06-01 DIAGNOSIS — R55 Syncope and collapse: Secondary | ICD-10-CM

## 2012-06-01 LAB — LIPID PANEL
Cholesterol: 134 mg/dL (ref 0–200)
HDL: 64.7 mg/dL (ref >39.00)
LDL Cholesterol: 60 mg/dL (ref 0–99)
Total CHOL/HDL Ratio: 2
Triglycerides: 48 mg/dL (ref 0.0–149.0)
VLDL: 9.6 mg/dL (ref 0.0–40.0)

## 2012-06-01 LAB — BASIC METABOLIC PANEL
BUN: 18 mg/dL (ref 6–23)
CO2: 27 mEq/L (ref 19–32)
Calcium: 9.8 mg/dL (ref 8.4–10.5)
Chloride: 102 mEq/L (ref 96–112)
Creatinine, Ser: 0.6 mg/dL (ref 0.4–1.2)
GFR: 102.19 mL/min (ref >60.00)
Glucose, Bld: 87 mg/dL (ref 70–99)
Potassium: 4.4 mEq/L (ref 3.5–5.1)
Sodium: 137 mEq/L (ref 135–145)

## 2012-06-01 LAB — HEPATIC FUNCTION PANEL
ALT: 15 U/L (ref 0–35)
AST: 22 U/L (ref 0–37)
Albumin: 4 g/dL (ref 3.5–5.2)
Alkaline Phosphatase: 35 U/L — ABNORMAL LOW (ref 39–117)
Bilirubin, Direct: 0.1 mg/dL (ref 0.0–0.3)
Total Bilirubin: 0.7 mg/dL (ref 0.3–1.2)
Total Protein: 7.3 g/dL (ref 6.0–8.3)

## 2012-06-01 NOTE — Assessment & Plan Note (Signed)
We'll check fasting lipids today. I'll check her cholesterol levels again we'll see her again in 6 months.

## 2012-06-01 NOTE — Progress Notes (Signed)
[name] Date of Birth  [birth year] [facility] [address]    [address] [place], [place]  [address] [phone]  Fax  [phone]  Problem list: 1. Coronary artery disease-status post PTCA cutting balloon to the first diagonal 2. Hypertension 3. Hyperlipidemia 4.  peripheral vascular disease-status post right carotid endarterectomy   History of Present Illness:  [name] is a 79 year old female with a history of coronary artery disease. She is  status post PTCA and cutting balloon procedure to her first diagonal artery. She also has a history of hypertension, hyperlipidemia and right carotid endarterectomy.  She denies any episodes of chest pain or shortness of breath.   She has had an occasional episode of lightheadedness.   She has not had any angina.  She complains of cold feet at night.  Jun 01, 2012:  [name] is doing OK - occasional indigestion. Denies any CP or dyspnea.     Several weeks ago she had a presyncopal episode while driving her car.  It ~ 5 seconds and resolved.   Current Outpatient Prescriptions on File Prior to Visit  Medication Sig Dispense Refill  . alendronate (FOSAMAX) 70 MG tablet Take 70 mg by mouth every 7 (seven) days. Take in the morning with a full glass of water, on an empty stomach, and do not take anything else by mouth or lie down for the next 30 min.       Marland Kitchen ALPRAZolam (XANAX) 0.5 MG tablet Take 0.5 mg by mouth at bedtime as needed. 1-2 prn      . aspirin 81 MG tablet Take 4 tablets (325 mg total) by mouth daily.      . Calcium Carbonate-Vitamin D (CALTRATE 600+D PO) Take by mouth 2 (two) times daily.       . hydrochlorothiazide (HYDRODIURIL) 25 MG tablet Take 1 tablet (25 mg total) by mouth daily.  90 tablet  3  . lisinopril (PRINIVIL,ZESTRIL) 40 MG tablet Take 1 tablet (40 mg total) by mouth daily.  90 tablet  3  . potassium chloride (MICRO-K) 10 MEQ CR capsule Take 1 capsule (10 mEq total) by mouth daily.  90 capsule  3  . rosuvastatin  (CRESTOR) 20 MG tablet Take 1 tablet (20 mg total) by mouth daily.      . valACYclovir (VALTREX) 1000 MG tablet Take 1,000 mg by mouth as needed.        No current facility-administered medications on file prior to visit.    Allergies  Allergen Reactions  . Bystolic (Nebivolol Hcl) Other (See Comments)    Mouth sores  . Codeine     Past Medical History  Diagnosis Date  . Coronary artery disease   . Hypertension   . Hyperlipemia   . S/P carotid endarterectomy     RIGHT CAROTID    Past Surgical History  Procedure Laterality Date  . Carotid endarterectomy    . Coronary angioplasty with stent placement      FIRST DIAGONAL    History  Smoking status  . Never Smoker   Smokeless tobacco  . Not on file    History  Alcohol Use No    Family History  Problem Relation Age of Onset  . Stroke Father     DECEASED  . Stroke Mother     DECEASED  . Hypertension Mother     DECEASED  . Stroke Brother     Reviw of Systems:  Reviewed in the HPI.  All other systems are negative.  Physical Exam: BP 122/71  Pulse 64  Ht 5\' 4"  (1.626 m)  Wt 136 lb (61.689 kg)  BMI 23.33 kg/m2 The patient is alert and oriented x 3.  The mood and affect are normal.   Skin: warm and dry.  Color is normal.    HEENT:   the sclera are nonicteric.  The mucous membranes are moist.  The carotids are 2+ without bruits.  There is no thyromegaly.  There is no JVD.    Lungs: clear.  The chest wall is non tender.    Heart: regular rate with a normal S1 and S2.  There are no murmurs, gallops, or rubs. The PMI is not displaced.     Abdomen: good bowel sounds.  There is no guarding or rebound.  There is no hepatosplenomegaly or tenderness.  There are no masses.   Extremities:  no clubbing, cyanosis, or edema.  The legs are without rashes.  The distal pulses are intact.   Neuro:  Cranial nerves II - XII are intact.  Motor and sensory functions are intact.    The gait is normal.  ECG: Jun 01, 2012:   NSR at 64, RBBB.  The RBBB is new since previous tracing   Assessment / Plan:

## 2012-06-01 NOTE — Progress Notes (Signed)
Patient ID: [name], female   DOB: [birth year], 79 y.o.   MRN: [id number] E-cardio Bramer 30 day event monitor placed on patient.

## 2012-06-01 NOTE — Patient Instructions (Addendum)
Rose can place it today Your physician has recommended that you wear an event monitor . Event monitors are medical devices that record the heart's electrical activity. Doctors most often [place] these monitors to diagnose arrhythmias. Arrhythmias are problems with the speed or rhythm of the heartbeat. The monitor is a small, portable device. You can wear one while you do your normal daily activities. This is usually used to diagnose what is causing palpitations/syncope (passing out).  Your physician recommends that you return for a FASTING lipid profile: today  Your physician wants you to follow-up in: 6 months You will receive a reminder letter in the mail two months in advance. If you don't receive a letter, please call our office to schedule the follow-up appointment.

## 2012-06-01 NOTE — Assessment & Plan Note (Signed)
[name] presents today with following an episode of near syncope that occurred several weeks ago. She was driving her car and had a sudden onset of dizziness and blurred vision. The episode lasted about 5 seconds and then resolved completely. It was not associated with chest pain. She was not gone depleted. She is not hypoglycemic.  She has a new right bundle branch block compared to a previous EKG. I'm  concerned that she may be having some advanced heart block. We'll place an event monitor on her for further evaluation.  Assuming that the monitor doesn't show anything, see her again in 6 months for followup visit.

## 2012-06-01 NOTE — Assessment & Plan Note (Signed)
Stable, no angina  

## 2012-06-01 NOTE — Assessment & Plan Note (Signed)
stable °

## 2012-07-04 ENCOUNTER — Telehealth: Payer: Self-pay | Admitting: Cardiovascular Disease

## 2012-07-04 NOTE — Telephone Encounter (Signed)
New Problem   Pt wants to know the results of her heart monitor that she turned in on Monday.

## 2012-07-04 NOTE — Telephone Encounter (Signed)
CALLED HER WITH ECARDIO RESULTS/ TOLD HER TO CALL WITH FURTHER PROBLEMS.

## 2012-07-09 ENCOUNTER — Encounter: Payer: Self-pay | Admitting: Cardiovascular Disease

## 2012-07-11 ENCOUNTER — Encounter: Payer: Self-pay | Admitting: Cardiovascular Disease

## 2012-07-18 ENCOUNTER — Encounter: Payer: Self-pay | Admitting: Cardiovascular Disease

## 2012-08-08 ENCOUNTER — Other Ambulatory Visit: Payer: Self-pay

## 2012-11-08 ENCOUNTER — Other Ambulatory Visit: Payer: Self-pay

## 2012-12-04 ENCOUNTER — Ambulatory Visit (INDEPENDENT_AMBULATORY_CARE_PROVIDER_SITE_OTHER): Payer: Medicare Other | Admitting: Cardiovascular Disease

## 2012-12-04 ENCOUNTER — Encounter: Payer: Self-pay | Admitting: Cardiovascular Disease

## 2012-12-04 VITALS — BP 120/70 | HR 67 | Ht 64.0 in | Wt 140.0 lb

## 2012-12-04 DIAGNOSIS — I2510 Atherosclerotic heart disease of native coronary artery without angina pectoris: Secondary | ICD-10-CM

## 2012-12-04 DIAGNOSIS — E785 Hyperlipidemia, unspecified: Secondary | ICD-10-CM

## 2012-12-04 DIAGNOSIS — I10 Essential (primary) hypertension: Secondary | ICD-10-CM

## 2012-12-04 MED ORDER — LISINOPRIL 40 MG PO TABS: 40.0000 mg | ORAL_TABLET | Freq: Every day | ORAL | Status: DC

## 2012-12-04 MED ORDER — ROSUVASTATIN CALCIUM 20 MG PO TABS: 20.0000 mg | ORAL_TABLET | Freq: Every day | ORAL | Status: DC

## 2012-12-04 MED ORDER — POTASSIUM CHLORIDE ER 10 MEQ PO CPCR: 10.0000 meq | ORAL_CAPSULE | Freq: Every day | ORAL | Status: DC

## 2012-12-04 MED ORDER — HYDROCHLOROTHIAZIDE 25 MG PO TABS: 25.0000 mg | ORAL_TABLET | Freq: Every day | ORAL | Status: DC

## 2012-12-04 NOTE — Progress Notes (Signed)
[name] Date of Birth  12-31-1930 [facility] [address]    [address] [place], [place]  [address] [phone]  Fax  [phone]  Problem list: 1. Coronary artery disease-status post PTCA cutting balloon to the first diagonal 2. Hypertension 3. Hyperlipidemia 4.  peripheral vascular disease-status post right carotid endarterectomy 5. cerebral aneurysm-status post coil embolization- 2008   History of Present Illness:  [name] is a 79 year old female with a history of coronary artery disease. She is  status post PTCA and cutting balloon procedure to her first diagonal artery. She also has a history of hypertension, hyperlipidemia and right carotid endarterectomy.  She denies any episodes of chest pain or shortness of breath.   She has had an occasional episode of lightheadedness.   She has not had any angina.  She complains of cold feet at night.  Jun 01, 2012:  [name] is doing OK - occasional indigestion. Denies any CP or dyspnea.     Several weeks ago she had a presyncopal episode while driving her car.  It ~ 5 seconds and resolved.   Dec. 2, 2014:  [name] is doing well. She's not had any further episodes of syncope or presyncope.  She exercises on occasion.  Current Outpatient Prescriptions on File Prior to Visit  Medication Sig Dispense Refill  . alendronate (FOSAMAX) 70 MG tablet Take 70 mg by mouth every 7 (seven) days. Take in the morning with a full glass of water, on an empty stomach, and do not take anything else by mouth or lie down for the next 30 min.       Marland Kitchen ALPRAZolam (XANAX) 0.5 MG tablet Take 0.5 mg by mouth at bedtime as needed. 1-2 prn      . aspirin 81 MG tablet Take 4 tablets (325 mg total) by mouth daily.      . Calcium Carbonate-Vitamin D (CALTRATE 600+D PO) Take by mouth 2 (two) times daily.       . hydrochlorothiazide (HYDRODIURIL) 25 MG tablet Take 1 tablet (25 mg total) by mouth daily.  90 tablet  3  . lisinopril (PRINIVIL,ZESTRIL) 40 MG tablet  Take 1 tablet (40 mg total) by mouth daily.  90 tablet  3  . potassium chloride (MICRO-K) 10 MEQ CR capsule Take 1 capsule (10 mEq total) by mouth daily.  90 capsule  3  . rosuvastatin (CRESTOR) 20 MG tablet Take 1 tablet (20 mg total) by mouth daily.      . valACYclovir (VALTREX) 1000 MG tablet Take 1,000 mg by mouth as needed.        No current facility-administered medications on file prior to visit.    Allergies  Allergen Reactions  . Bystolic [Nebivolol Hcl] Other (See Comments)    Mouth sores  . Codeine     Past Medical History  Diagnosis Date  . Coronary artery disease   . Hypertension   . Hyperlipemia   . S/P carotid endarterectomy     RIGHT CAROTID    Past Surgical History  Procedure Laterality Date  . Carotid endarterectomy    . Coronary angioplasty with stent placement      FIRST DIAGONAL    History  Smoking status  . Never Smoker   Smokeless tobacco  . Not on file    History  Alcohol Use No    Family History  Problem Relation Age of Onset  . Stroke Father     DECEASED  . Stroke Mother     DECEASED  .  Hypertension Mother     DECEASED  . Stroke Brother     Reviw of Systems:  Reviewed in the HPI.  All other systems are negative.  Physical Exam: BP 120/70  Pulse 67  Ht 5\' 4"  (1.626 m)  Wt 140 lb (63.504 kg)  BMI 24.02 kg/m2 The patient is alert and oriented x 3.  The mood and affect are normal.   Skin: warm and dry.  Color is normal.    HEENT:   the sclera are nonicteric.  The mucous membranes are moist.  The carotids are 2+ without bruits.  There is no thyromegaly.  There is no JVD.    Lungs: clear.  The chest wall is non tender.    Heart: regular rate with a normal S1 and S2.  There are no murmurs, gallops, or rubs. The PMI is not displaced.     Abdomen: good bowel sounds.  There is no guarding or rebound.  There is no hepatosplenomegaly or tenderness.  There are no masses.   Extremities:  no clubbing, cyanosis, or edema.  The legs  are without rashes.  The distal pulses are intact.   Neuro:  Cranial nerves II - XII are intact.  Motor and sensory functions are intact.    The gait is normal.  ECG: Dec. 2, 2014:  NSR at 64, RBBB.  The RBBB is unchanged from previous ECG   Assessment / Plan:

## 2012-12-04 NOTE — Assessment & Plan Note (Addendum)
Ethylene  has done well. She's not had any episodes of chest pain or shortness breath. She's not had any further episodes of syncope. We'll continue with her same medications.

## 2012-12-04 NOTE — Patient Instructions (Signed)
Your physician wants you to follow-up in: 6 MONTHS You will receive a reminder letter in the mail two months in advance. If you don't receive a letter, please call our office to schedule the follow-up appointment.  Your physician recommends that you return for a FASTING lipid profile: 6 MONTHS    

## 2013-01-24 ENCOUNTER — Telehealth: Payer: Self-pay | Admitting: Cardiovascular Disease

## 2013-01-24 NOTE — Telephone Encounter (Signed)
Walk In pt Form " Sealed Envelope" Dropped off gave to [facility]

## 2013-02-06 ENCOUNTER — Other Ambulatory Visit: Payer: Self-pay | Admitting: *Deleted

## 2013-02-06 ENCOUNTER — Telehealth: Payer: Self-pay | Admitting: Cardiovascular Disease

## 2013-02-06 MED ORDER — ROSUVASTATIN CALCIUM 20 MG PO TABS: 20.0000 mg | ORAL_TABLET | Freq: Every day | ORAL | Status: DC

## 2013-02-06 NOTE — Telephone Encounter (Signed)
Order resent to fax number provided.

## 2013-02-06 NOTE — Telephone Encounter (Signed)
Follow up     Pt calling back to make sure she has the correct fax number.

## 2013-02-06 NOTE — Telephone Encounter (Signed)
New message    Patient calling    Fax sent TO  [employer]  prescription plan . Pharmacy receive the fax however it was date for  2014. Instead of 2015.   Fax # [phone]. Send a cover sheet  .   crestor 40 mg .

## 2013-03-16 ENCOUNTER — Encounter: Payer: Self-pay | Admitting: Cardiovascular Disease

## 2013-05-31 ENCOUNTER — Ambulatory Visit (INDEPENDENT_AMBULATORY_CARE_PROVIDER_SITE_OTHER): Payer: Commercial Managed Care - HMO | Admitting: *Deleted

## 2013-05-31 DIAGNOSIS — I10 Essential (primary) hypertension: Secondary | ICD-10-CM

## 2013-05-31 DIAGNOSIS — E785 Hyperlipidemia, unspecified: Secondary | ICD-10-CM

## 2013-05-31 DIAGNOSIS — I2510 Atherosclerotic heart disease of native coronary artery without angina pectoris: Secondary | ICD-10-CM

## 2013-05-31 LAB — BASIC METABOLIC PANEL
BUN: 16 mg/dL (ref 6–23)
CO2: 27 mEq/L (ref 19–32)
Calcium: 9.4 mg/dL (ref 8.4–10.5)
Chloride: 100 mEq/L (ref 96–112)
Creatinine, Ser: 0.7 mg/dL (ref 0.4–1.2)
GFR: 82.59 mL/min (ref >60.00)
Glucose, Bld: 89 mg/dL (ref 70–99)
Potassium: 4 mEq/L (ref 3.5–5.1)
Sodium: 134 mEq/L — ABNORMAL LOW (ref 135–145)

## 2013-05-31 LAB — LIPID PANEL
Cholesterol: 116 mg/dL (ref 0–200)
HDL: 57.5 mg/dL (ref >39.00)
LDL Cholesterol: 46 mg/dL (ref 0–99)
Total CHOL/HDL Ratio: 2
Triglycerides: 65 mg/dL (ref 0.0–149.0)
VLDL: 13 mg/dL (ref 0.0–40.0)

## 2013-05-31 LAB — HEPATIC FUNCTION PANEL
ALT: 15 U/L (ref 0–35)
AST: 21 U/L (ref 0–37)
Albumin: 3.8 g/dL (ref 3.5–5.2)
Alkaline Phosphatase: 26 U/L — ABNORMAL LOW (ref 39–117)
Bilirubin, Direct: 0.1 mg/dL (ref 0.0–0.3)
Total Bilirubin: 0.6 mg/dL (ref 0.2–1.2)
Total Protein: 6.5 g/dL (ref 6.0–8.3)

## 2013-06-04 ENCOUNTER — Encounter: Payer: Self-pay | Admitting: Cardiovascular Disease

## 2013-06-04 ENCOUNTER — Ambulatory Visit (INDEPENDENT_AMBULATORY_CARE_PROVIDER_SITE_OTHER): Payer: Commercial Managed Care - HMO | Admitting: Cardiovascular Disease

## 2013-06-04 VITALS — BP 166/74 | HR 76 | Ht 64.0 in | Wt 131.1 lb

## 2013-06-04 DIAGNOSIS — I4949 Other premature depolarization: Secondary | ICD-10-CM

## 2013-06-04 DIAGNOSIS — E785 Hyperlipidemia, unspecified: Secondary | ICD-10-CM

## 2013-06-04 DIAGNOSIS — I2510 Atherosclerotic heart disease of native coronary artery without angina pectoris: Secondary | ICD-10-CM

## 2013-06-04 DIAGNOSIS — I493 Ventricular premature depolarization: Secondary | ICD-10-CM

## 2013-06-04 DIAGNOSIS — I10 Essential (primary) hypertension: Secondary | ICD-10-CM

## 2013-06-04 NOTE — Progress Notes (Signed)
[name] Date of Birth  March 14, 1930 [facility] [address]    [place], [place]  [address] [phone]  Fax  [phone]  Problem list: 1. Coronary artery disease-status post PTCA cutting balloon to the first diagonal 2. Hypertension 3. Hyperlipidemia 4.  peripheral vascular disease-status post right carotid endarterectomy 5. cerebral aneurysm-status post coil embolization- 2008   History of Present Illness:  [name] is a 80 year old female with a history of coronary artery disease. She is  status post PTCA and cutting balloon procedure to her first diagonal artery. She also has a history of hypertension, hyperlipidemia and right carotid endarterectomy.  She denies any episodes of chest pain or shortness of breath.   She has had an occasional episode of lightheadedness.   She has not had any angina.  She complains of cold feet at night.  Jun 01, 2012:  [name] is doing OK - occasional indigestion. Denies any CP or dyspnea.     Several weeks ago she had a presyncopal episode while driving her car.  It ~ 5 seconds and resolved.   Dec. 2, 2014:  [name] is doing well. She's not had any further episodes of syncope or presyncope.  She exercises on occasion.  June 04, 2013:  [name] is upset today.  Her daughter has severe mental retardation and is in a group home and the group home is closing.    She is doing OK from a cardiac standpoint.    She has not had a chance to do much exercise or other activities.  She    Current Outpatient Prescriptions on File Prior to Visit  Medication Sig Dispense Refill  . alendronate (FOSAMAX) 70 MG tablet Take 70 mg by mouth every 7 (seven) days. Take in the morning with a full glass of water, on an empty stomach, and do not take anything else by mouth or lie down for the next 30 min.       Marland Kitchen ALPRAZolam (XANAX) 0.5 MG tablet Take 0.5 mg by mouth at bedtime as needed. 1-2 prn      . Calcium Carbonate-Vitamin D (CALTRATE 600+D PO) Take by  mouth 2 (two) times daily.       . cephALEXin (KEFLEX) 500 MG capsule Take 500 mg by mouth as needed.      . hydrochlorothiazide (HYDRODIURIL) 25 MG tablet Take 1 tablet (25 mg total) by mouth daily.  90 tablet  3  . lisinopril (PRINIVIL,ZESTRIL) 40 MG tablet Take 1 tablet (40 mg total) by mouth daily.  90 tablet  3  . potassium chloride (MICRO-K) 10 MEQ CR capsule Take 1 capsule (10 mEq total) by mouth daily.  90 capsule  3  . rosuvastatin (CRESTOR) 20 MG tablet Take 1 tablet (20 mg total) by mouth daily.  90 tablet  3  . valACYclovir (VALTREX) 1000 MG tablet Take 1,000 mg by mouth as needed.        No current facility-administered medications on file prior to visit.    Allergies  Allergen Reactions  . Bystolic [Nebivolol Hcl] Other (See Comments)    Mouth sores  . Codeine     Past Medical History  Diagnosis Date  . Coronary artery disease   . Hypertension   . Hyperlipemia   . S/P carotid endarterectomy     RIGHT CAROTID    Past Surgical History  Procedure Laterality Date  . Carotid endarterectomy    . Coronary angioplasty with stent placement      FIRST  DIAGONAL    History  Smoking status  . Never Smoker   Smokeless tobacco  . Not on file    History  Alcohol Use No    Family History  Problem Relation Age of Onset  . Stroke Father     DECEASED  . Stroke Mother     DECEASED  . Hypertension Mother     DECEASED  . Stroke Brother     Reviw of Systems:  Reviewed in the HPI.  All other systems are negative.  Physical Exam: BP 166/74  Pulse 76  Ht 5\' 4"  (1.626 m)  Wt 131 lb 1.9 oz (59.476 kg)  BMI 22.50 kg/m2  SpO2 95% The patient is alert and oriented x 3.  The mood and affect are normal.   Skin: warm and dry.  Color is normal.    HEENT:   the sclera are nonicteric.  The mucous membranes are moist.  The carotids are 2+ without bruits.  There is no thyromegaly.  There is no JVD.    Lungs: clear.  The chest wall is non tender.    Heart: regular rate  with a normal S1 and S2.  There are no murmurs, gallops, or rubs. The PMI is not displaced.     Abdomen: good bowel sounds.  There is no guarding or rebound.  There is no hepatosplenomegaly or tenderness.  There are no masses.   Extremities:  no clubbing, cyanosis, or edema.  The legs are without rashes.  The distal pulses are intact.   Neuro:  Cranial nerves II - XII are intact.  Motor and sensory functions are intact.    The gait is normal.  ECG: June 04, 2013:  NSR at 68 with occasional PVCs RBBB   Assessment / Plan:

## 2013-06-04 NOTE — Assessment & Plan Note (Signed)
There is not having any angina. She's had a lot of stress related to her daughter who will be moving out of the group home and into her home.  Fortunately she's not having any angina. She does have some palpitations which are probably due to premature ventricular contractions. I'll see her again in 6 months.

## 2013-06-04 NOTE — Patient Instructions (Signed)
Your physician recommends that you continue on your current medications as directed. Please refer to the Current Medication list given to you today.  Your physician wants you to follow-up in: 6 months with Dr. Nahser.  You will receive a reminder letter in the mail two months in advance. If you don't receive a letter, please call our office to schedule the follow-up appointment.  

## 2013-06-26 ENCOUNTER — Other Ambulatory Visit (HOSPITAL_COMMUNITY): Payer: Self-pay | Admitting: Interventional Radiology

## 2013-06-26 DIAGNOSIS — I671 Cerebral aneurysm, nonruptured: Secondary | ICD-10-CM

## 2013-07-01 ENCOUNTER — Ambulatory Visit (HOSPITAL_COMMUNITY)
Admission: RE | Admit: 2013-07-01 | Discharge: 2013-07-01 | Disposition: A | Discharge status: Home / Self Care | Payer: Medicare HMO | Source: Ambulatory Visit | Attending: Interventional Radiology | Admitting: Interventional Radiology

## 2013-07-01 DIAGNOSIS — I671 Cerebral aneurysm, nonruptured: Secondary | ICD-10-CM

## 2013-07-01 DIAGNOSIS — Z09 Encounter for follow-up examination after completed treatment for conditions other than malignant neoplasm: Secondary | ICD-10-CM | POA: Insufficient documentation

## 2013-07-01 MED ORDER — IOHEXOL 350 MG/ML SOLN
50.0000 mL | Freq: Once | INTRAVENOUS | Status: AC | PRN
  Administered 2013-07-01: 50 mL via INTRAVENOUS

## 2013-07-02 ENCOUNTER — Telehealth (HOSPITAL_COMMUNITY): Payer: Self-pay | Admitting: Interventional Radiology

## 2013-07-02 NOTE — Telephone Encounter (Signed)
Called pt, told her that her CTA Head was fine and the Deveshwar suggests a follow-up CTA Head in 1 year.  Patient states understanding and is in agreement with this plan. JMichaux

## 2013-10-04 ENCOUNTER — Other Ambulatory Visit: Payer: Self-pay | Admitting: Family Medicine

## 2013-10-04 DIAGNOSIS — IMO0002 Reserved for concepts with insufficient information to code with codable children: Secondary | ICD-10-CM

## 2013-10-04 DIAGNOSIS — R229 Localized swelling, mass and lump, unspecified: Principal | ICD-10-CM

## 2013-10-07 ENCOUNTER — Ambulatory Visit
Admission: RE | Admit: 2013-10-07 | Discharge: 2013-10-07 | Disposition: A | Discharge status: Home / Self Care | Payer: Commercial Managed Care - HMO | Source: Ambulatory Visit | Attending: Family Medicine | Admitting: Family Medicine

## 2013-10-07 DIAGNOSIS — R229 Localized swelling, mass and lump, unspecified: Principal | ICD-10-CM

## 2013-10-07 DIAGNOSIS — IMO0002 Reserved for concepts with insufficient information to code with codable children: Secondary | ICD-10-CM

## 2013-11-12 ENCOUNTER — Ambulatory Visit: Payer: Commercial Managed Care - HMO | Admitting: Cardiovascular Disease

## 2013-12-17 ENCOUNTER — Ambulatory Visit (INDEPENDENT_AMBULATORY_CARE_PROVIDER_SITE_OTHER): Payer: Commercial Managed Care - HMO | Admitting: Cardiovascular Disease

## 2013-12-17 ENCOUNTER — Encounter: Payer: Self-pay | Admitting: Cardiovascular Disease

## 2013-12-17 VITALS — BP 114/50 | HR 84 | Ht 64.0 in | Wt 135.8 lb

## 2013-12-17 DIAGNOSIS — I10 Essential (primary) hypertension: Secondary | ICD-10-CM

## 2013-12-17 DIAGNOSIS — I2510 Atherosclerotic heart disease of native coronary artery without angina pectoris: Secondary | ICD-10-CM

## 2013-12-17 DIAGNOSIS — Z9889 Other specified postprocedural states: Secondary | ICD-10-CM

## 2013-12-17 MED ORDER — POTASSIUM CHLORIDE ER 10 MEQ PO CPCR: 10.0000 meq | ORAL_CAPSULE | Freq: Every day | ORAL | Status: DC

## 2013-12-17 MED ORDER — ROSUVASTATIN CALCIUM 20 MG PO TABS: 20.0000 mg | ORAL_TABLET | Freq: Every day | ORAL | Status: DC

## 2013-12-17 MED ORDER — HYDROCHLOROTHIAZIDE 25 MG PO TABS: 25.0000 mg | ORAL_TABLET | Freq: Every day | ORAL | Status: DC

## 2013-12-17 MED ORDER — LISINOPRIL 40 MG PO TABS
40.0000 mg | ORAL_TABLET | Freq: Every day | ORAL | Status: DC
Start: 2013-12-17 — End: 2013-12-20

## 2013-12-17 NOTE — Patient Instructions (Signed)
Your physician recommends that you continue on your current medications as directed. Please refer to the Current Medication list given to you today.  Your physician wants you to follow-up in: 6 months with Dr. Acie Fredrickson, and with fasting labs. You will receive a reminder letter in the mail two months in advance. If you don't receive a letter, please call our office to schedule the follow-up appointment.  You will need to FAST for this appointment. Nothing to eat or drink after midnight the night before except water.

## 2013-12-17 NOTE — Assessment & Plan Note (Signed)
BP is well controlled 

## 2013-12-17 NOTE — Addendum Note (Signed)
Addended by: Stanton Kidney on: 12/17/2013 04:20 PM   Modules accepted: Orders

## 2013-12-17 NOTE — Assessment & Plan Note (Signed)
Carotids sound great. No bruit

## 2013-12-17 NOTE — Progress Notes (Signed)
[name] Date of Birth  1930-10-09 [facility] [address]    [place], [place]  [address] [phone]  Fax  [phone]  Problem list: 1. Coronary artery disease-status post PTCA cutting balloon to the first diagonal 2. Hypertension 3. Hyperlipidemia 4.  peripheral vascular disease-status post right carotid endarterectomy 5. cerebral aneurysm-status post coil embolization- 2008   History of Present Illness:  [name] is a 80 year old female with a history of coronary artery disease. She is  status post PTCA and cutting balloon procedure to her first diagonal artery. She also has a history of hypertension, hyperlipidemia and right carotid endarterectomy.  She denies any episodes of chest pain or shortness of breath.   She has had an occasional episode of lightheadedness.   She has not had any angina.  She complains of cold feet at night.  Jun 01, 2012:  [name] is doing OK - occasional indigestion. Denies any CP or dyspnea.     Several weeks ago she had a presyncopal episode while driving her car.  It ~ 5 seconds and resolved.   Dec. 2, 2014:  [name] is doing well. She's not had any further episodes of syncope or presyncope.  She exercises on occasion.  June 04, 2013:  [name] is upset today.  Her daughter has severe mental retardation and is in a group home and the group home is closing.    She is doing OK from a cardiac standpoint.    She has not had a chance to do much exercise or other activities.    Dec. 15, 2015:  [name] is a 80 yo with hx of CAD, HTN,  PVD, hyperlipidemia.  No CP or dyspnea.      Current Outpatient Prescriptions on File Prior to Visit  Medication Sig Dispense Refill  . ALPRAZolam (XANAX) 0.5 MG tablet Take 0.5 mg by mouth at bedtime as needed. 1-2 prn    . aspirin EC 81 MG tablet Take 81 mg by mouth once.    . Calcium Carbonate-Vitamin D (CALTRATE 600+D PO) Take by mouth 2 (two) times daily.     . cephALEXin (KEFLEX) 500 MG capsule Take  500 mg by mouth as needed.    . hydrochlorothiazide (HYDRODIURIL) 25 MG tablet Take 1 tablet (25 mg total) by mouth daily. 90 tablet 3  . lisinopril (PRINIVIL,ZESTRIL) 40 MG tablet Take 1 tablet (40 mg total) by mouth daily. 90 tablet 3  . potassium chloride (MICRO-K) 10 MEQ CR capsule Take 1 capsule (10 mEq total) by mouth daily. 90 capsule 3  . rosuvastatin (CRESTOR) 20 MG tablet Take 1 tablet (20 mg total) by mouth daily. 90 tablet 3  . valACYclovir (VALTREX) 1000 MG tablet Take 1,000 mg by mouth as needed.      No current facility-administered medications on file prior to visit.    Allergies  Allergen Reactions  . Bystolic [Nebivolol Hcl] Other (See Comments)    Mouth sores  . Codeine     Strange feeling per patient    Past Medical History  Diagnosis Date  . Coronary artery disease   . Hypertension   . Hyperlipemia   . S/P carotid endarterectomy     RIGHT CAROTID    Past Surgical History  Procedure Laterality Date  . Carotid endarterectomy    . Coronary angioplasty with stent placement      FIRST DIAGONAL    History  Smoking status  . Never Smoker   Smokeless tobacco  . Not on  file    History  Alcohol Use No    Family History  Problem Relation Age of Onset  . Stroke Father     DECEASED  . Stroke Mother     DECEASED  . Hypertension Mother     DECEASED  . Stroke Brother     Reviw of Systems:  Reviewed in the HPI.  All other systems are negative.  Physical Exam: BP 114/50 mmHg  Pulse 84  Ht 5\' 4"  (1.626 m)  Wt 135 lb 12.8 oz (61.598 kg)  BMI 23.30 kg/m2 The patient is alert and oriented x 3.  The mood and affect are normal.   Skin: warm and dry.  Color is normal.    HEENT:   the sclera are nonicteric.  The mucous membranes are moist.  The carotids are 2+ without bruits.  There is no thyromegaly.  There is no JVD.    Lungs: clear.  The chest wall is non tender.    Heart: regular rate with a normal S1 and S2.  There are no murmurs, gallops, or  rubs. The PMI is not displaced.     Abdomen: good bowel sounds.  There is no guarding or rebound.  There is no hepatosplenomegaly or tenderness.  There are no masses.   Extremities:  no clubbing, cyanosis, or edema.  The legs are without rashes.  The distal pulses are intact.   Neuro:  Cranial nerves II - XII are intact.  Motor and sensory functions are intact.    The gait is normal.  ECG: June 04, 2013:  NSR at 82 with occasional PVCs RBBB   Assessment / Plan:

## 2013-12-17 NOTE — Assessment & Plan Note (Signed)
No angina Continue current meds VS are stable

## 2013-12-20 ENCOUNTER — Other Ambulatory Visit: Payer: Self-pay | Admitting: *Deleted

## 2013-12-20 MED ORDER — LISINOPRIL 40 MG PO TABS: 40.0000 mg | ORAL_TABLET | Freq: Every day | ORAL | Status: DC

## 2013-12-20 MED ORDER — HYDROCHLOROTHIAZIDE 25 MG PO TABS
25.0000 mg | ORAL_TABLET | Freq: Every day | ORAL | Status: DC
Start: 2013-12-20 — End: 2014-09-17

## 2013-12-20 MED ORDER — POTASSIUM CHLORIDE ER 10 MEQ PO CPCR: 10.0000 meq | ORAL_CAPSULE | Freq: Every day | ORAL | Status: DC

## 2014-01-21 ENCOUNTER — Telehealth: Payer: Self-pay | Admitting: Cardiovascular Disease

## 2014-01-21 NOTE — Telephone Encounter (Signed)
Called and left message for patient to call me back.

## 2014-01-21 NOTE — Telephone Encounter (Signed)
New Msg         Pt calling in regards to paperwork she left to be completed in order to get Crestor 20 mg prescription.     Please return pt call, thanks.

## 2014-01-21 NOTE — Telephone Encounter (Signed)
Spoke with patient and advised her that paperwork has been completed and placed at front desk for pick-up; patient verbalized understanding and gratitude

## 2014-02-12 ENCOUNTER — Telehealth: Payer: Self-pay | Admitting: Nurse Practitioner

## 2014-02-12 DIAGNOSIS — E785 Hyperlipidemia, unspecified: Secondary | ICD-10-CM

## 2014-02-12 MED ORDER — ATORVASTATIN CALCIUM 40 MG PO TABS: 40.0000 mg | ORAL_TABLET | Freq: Every day | ORAL | Status: DC

## 2014-02-12 NOTE — Telephone Encounter (Signed)
Received letter from patient that she cannot afford to continue taking Crestor and would like to know what medication Dr. Acie Fredrickson wants to prescribe in its place.  Per Dr. Acie Fredrickson, prescribe Atorvastatin 40 mg once daily and recheck labs in 3 months.  Rx sent to [facility] per patient request and patient scheduled for lab appointment in May.  Patient verbalized understanding and agreement with plan of care.

## 2014-04-07 ENCOUNTER — Other Ambulatory Visit: Payer: Self-pay | Admitting: Family Medicine

## 2014-04-07 ENCOUNTER — Ambulatory Visit
Admission: RE | Admit: 2014-04-07 | Discharge: 2014-04-07 | Disposition: A | Discharge status: Home / Self Care | Payer: Commercial Managed Care - HMO | Source: Ambulatory Visit | Attending: Family Medicine | Admitting: Family Medicine

## 2014-04-07 DIAGNOSIS — M47816 Spondylosis without myelopathy or radiculopathy, lumbar region: Secondary | ICD-10-CM | POA: Diagnosis not present

## 2014-04-07 DIAGNOSIS — I10 Essential (primary) hypertension: Secondary | ICD-10-CM | POA: Diagnosis not present

## 2014-04-07 DIAGNOSIS — M545 Low back pain: Secondary | ICD-10-CM | POA: Diagnosis not present

## 2014-04-07 DIAGNOSIS — E780 Pure hypercholesterolemia: Secondary | ICD-10-CM | POA: Diagnosis not present

## 2014-04-07 DIAGNOSIS — F411 Generalized anxiety disorder: Secondary | ICD-10-CM | POA: Diagnosis not present

## 2014-04-07 DIAGNOSIS — M4184 Other forms of scoliosis, thoracic region: Secondary | ICD-10-CM | POA: Diagnosis not present

## 2014-04-07 DIAGNOSIS — I700 Atherosclerosis of aorta: Secondary | ICD-10-CM | POA: Diagnosis not present

## 2014-04-07 DIAGNOSIS — Z0000 Encounter for general adult medical examination without abnormal findings: Secondary | ICD-10-CM | POA: Diagnosis not present

## 2014-04-07 DIAGNOSIS — E538 Deficiency of other specified B group vitamins: Secondary | ICD-10-CM | POA: Diagnosis not present

## 2014-04-07 DIAGNOSIS — M549 Dorsalgia, unspecified: Secondary | ICD-10-CM

## 2014-04-07 DIAGNOSIS — M4316 Spondylolisthesis, lumbar region: Secondary | ICD-10-CM | POA: Diagnosis not present

## 2014-05-26 ENCOUNTER — Other Ambulatory Visit (INDEPENDENT_AMBULATORY_CARE_PROVIDER_SITE_OTHER): Payer: Commercial Managed Care - HMO | Admitting: *Deleted

## 2014-05-26 DIAGNOSIS — E785 Hyperlipidemia, unspecified: Secondary | ICD-10-CM

## 2014-05-26 LAB — LIPID PANEL
Cholesterol: 131 mg/dL (ref 0–200)
HDL: 64.2 mg/dL (ref >39.00)
LDL Cholesterol: 57 mg/dL (ref 0–99)
NonHDL: 66.8
Total CHOL/HDL Ratio: 2
Triglycerides: 51 mg/dL (ref 0.0–149.0)
VLDL: 10.2 mg/dL (ref 0.0–40.0)

## 2014-05-26 LAB — BASIC METABOLIC PANEL
BUN: 20 mg/dL (ref 6–23)
CO2: 28 mEq/L (ref 19–32)
Calcium: 9.3 mg/dL (ref 8.4–10.5)
Chloride: 99 mEq/L (ref 96–112)
Creatinine, Ser: 0.63 mg/dL (ref 0.40–1.20)
GFR: 96.11 mL/min (ref >60.00)
Glucose, Bld: 100 mg/dL — ABNORMAL HIGH (ref 70–99)
Potassium: 4.1 mEq/L (ref 3.5–5.1)
Sodium: 133 mEq/L — ABNORMAL LOW (ref 135–145)

## 2014-05-26 LAB — HEPATIC FUNCTION PANEL
ALT: 14 U/L (ref 0–35)
AST: 21 U/L (ref 0–37)
Albumin: 3.8 g/dL (ref 3.5–5.2)
Alkaline Phosphatase: 53 U/L (ref 39–117)
Bilirubin, Direct: 0.1 mg/dL (ref 0.0–0.3)
Total Bilirubin: 0.5 mg/dL (ref 0.2–1.2)
Total Protein: 6.8 g/dL (ref 6.0–8.3)

## 2014-05-26 NOTE — Addendum Note (Signed)
Addended by: Eulis Foster on: 05/26/2014 09:38 AM   Modules accepted: Orders

## 2014-05-27 ENCOUNTER — Other Ambulatory Visit: Payer: Commercial Managed Care - HMO

## 2014-06-13 DIAGNOSIS — D1801 Hemangioma of skin and subcutaneous tissue: Secondary | ICD-10-CM | POA: Diagnosis not present

## 2014-06-13 DIAGNOSIS — L821 Other seborrheic keratosis: Secondary | ICD-10-CM | POA: Diagnosis not present

## 2014-06-13 DIAGNOSIS — D225 Melanocytic nevi of trunk: Secondary | ICD-10-CM | POA: Diagnosis not present

## 2014-06-13 DIAGNOSIS — L814 Other melanin hyperpigmentation: Secondary | ICD-10-CM | POA: Diagnosis not present

## 2014-07-10 DIAGNOSIS — F419 Anxiety disorder, unspecified: Secondary | ICD-10-CM | POA: Diagnosis not present

## 2014-07-10 DIAGNOSIS — G2581 Restless legs syndrome: Secondary | ICD-10-CM | POA: Diagnosis not present

## 2014-08-08 DIAGNOSIS — Z1231 Encounter for screening mammogram for malignant neoplasm of breast: Secondary | ICD-10-CM | POA: Diagnosis not present

## 2014-09-17 ENCOUNTER — Other Ambulatory Visit: Payer: Self-pay

## 2014-09-17 MED ORDER — POTASSIUM CHLORIDE ER 10 MEQ PO CPCR: 10.0000 meq | ORAL_CAPSULE | Freq: Every day | ORAL | Status: DC

## 2014-09-17 MED ORDER — HYDROCHLOROTHIAZIDE 25 MG PO TABS: 25.0000 mg | ORAL_TABLET | Freq: Every day | ORAL | Status: DC

## 2014-09-17 MED ORDER — LISINOPRIL 40 MG PO TABS: 40.0000 mg | ORAL_TABLET | Freq: Every day | ORAL | Status: DC

## 2014-10-02 ENCOUNTER — Ambulatory Visit (INDEPENDENT_AMBULATORY_CARE_PROVIDER_SITE_OTHER): Payer: Commercial Managed Care - HMO | Admitting: Cardiovascular Disease

## 2014-10-02 ENCOUNTER — Encounter: Payer: Self-pay | Admitting: Cardiovascular Disease

## 2014-10-02 VITALS — BP 104/60 | HR 65 | Ht 64.0 in | Wt 144.8 lb

## 2014-10-02 DIAGNOSIS — I2583 Coronary atherosclerosis due to lipid rich plaque: Principal | ICD-10-CM

## 2014-10-02 DIAGNOSIS — I2510 Atherosclerotic heart disease of native coronary artery without angina pectoris: Secondary | ICD-10-CM | POA: Diagnosis not present

## 2014-10-02 DIAGNOSIS — E785 Hyperlipidemia, unspecified: Secondary | ICD-10-CM

## 2014-10-02 NOTE — Progress Notes (Signed)
[name] Date of Birth  1930-05-22 [facility] [address]    [place], [place]  [address] [phone]  Fax  [phone]  Problem list: 1. Coronary artery disease-status post PTCA cutting balloon to the first diagonal 2. Hypertension 3. Hyperlipidemia 4.  peripheral vascular disease-status post right carotid endarterectomy 5. cerebral aneurysm-status post coil embolization- 2008   History of Present Illness:  [name] is a 81 year old female with a history of coronary artery disease. She is  status post PTCA and cutting balloon procedure to her first diagonal artery. She also has a history of hypertension, hyperlipidemia and right carotid endarterectomy.  She denies any episodes of chest pain or shortness of breath.   She has had an occasional episode of lightheadedness.   She has not had any angina.  She complains of cold feet at night.  Jun 01, 2012:  [name] is doing OK - occasional indigestion. Denies any CP or dyspnea.     Several weeks ago she had a presyncopal episode while driving her car.  It ~ 5 seconds and resolved.   Dec. 2, 2014:  [name] is doing well. She's not had any further episodes of syncope or presyncope.  She exercises on occasion.  June 04, 2013:  [name] is upset today.  Her daughter has severe mental retardation and is in a group home and the group home is closing.    She is doing OK from a cardiac standpoint.    She has not had a chance to do much exercise or other activities.    Dec. 15, 2015:  [name] is a 81 yo with hx of CAD, HTN,  PVD, hyperlipidemia.  No CP or dyspnea.  Sept. 29, 2016:  No CP or dyspnea.  Doing well overall     Current Outpatient Prescriptions on File Prior to Visit  Medication Sig Dispense Refill  . ALPRAZolam (XANAX) 0.5 MG tablet Take 0.5 mg by mouth at bedtime as needed for sleep (may take two tablets if needed).     Marland Kitchen aspirin EC 81 MG tablet Take 81 mg by mouth once.    Marland Kitchen atorvastatin (LIPITOR) 40 MG tablet  Take 1 tablet (40 mg total) by mouth daily. 90 tablet 3  . Calcium Carbonate-Vitamin D (CALTRATE 600+D PO) Take by mouth 2 (two) times daily.     . hydrochlorothiazide (HYDRODIURIL) 25 MG tablet Take 1 tablet (25 mg total) by mouth daily. 90 tablet 0  . lisinopril (PRINIVIL,ZESTRIL) 40 MG tablet Take 1 tablet (40 mg total) by mouth daily. 90 tablet 0  . potassium chloride (MICRO-K) 10 MEQ CR capsule Take 1 capsule (10 mEq total) by mouth daily. 90 capsule 0  . valACYclovir (VALTREX) 1000 MG tablet Take 1,000 mg by mouth as needed (1 tablet daily as needed for breakouts).      No current facility-administered medications on file prior to visit.    Allergies  Allergen Reactions  . Bystolic [Nebivolol Hcl] Other (See Comments)    Mouth sores  . Codeine     Strange feeling per patient    Past Medical History  Diagnosis Date  . Coronary artery disease   . Hypertension   . Hyperlipemia   . S/P carotid endarterectomy     RIGHT CAROTID    Past Surgical History  Procedure Laterality Date  . Carotid endarterectomy    . Coronary angioplasty with stent placement      FIRST DIAGONAL    History  Smoking status  .  Never Smoker   Smokeless tobacco  . Not on file    History  Alcohol Use No    Family History  Problem Relation Age of Onset  . Stroke Father     DECEASED  . Stroke Mother     DECEASED  . Hypertension Mother     DECEASED  . Stroke Brother     Reviw of Systems:  Reviewed in the HPI.  All other systems are negative.  Physical Exam: BP 104/60 mmHg  Pulse 65  Ht 5\' 4"  (1.626 m)  Wt 65.681 kg (144 lb 12.8 oz)  BMI 24.84 kg/m2 The patient is alert and oriented x 3.  The mood and affect are normal.   Skin: warm and dry.  Color is normal.    HEENT:   the sclera are nonicteric.  The mucous membranes are moist.  The carotids are 2+ without bruits.  There is no thyromegaly.  There is no JVD.    Lungs: clear.  The chest wall is non tender.    Heart: regular rate  with a normal S1 and S2.  There are no murmurs, gallops, or rubs. The PMI is not displaced.     Abdomen: good bowel sounds.  There is no guarding or rebound.  There is no hepatosplenomegaly or tenderness.  There are no masses.   Extremities:  no clubbing, cyanosis, or edema.  The legs are without rashes.  The distal pulses are intact.   Neuro:  Cranial nerves II - XII are intact.  Motor and sensory functions are intact.    The gait is normal.  ECG: Sept. 29, 2016:  NSR at 67.  occasional PVCs   Assessment / Plan:   1. Coronary artery disease-status post PTCA cutting balloon to the first diagonal -  She's not having any angina. Continue current medications. 2. Hypertension-   Her blood pressure is well-controlled. Continue current medications. 3. Hyperlipidemia-   Her most recent labs look great. We'll recheck fasting lipid, liver enzymes, basic medical profile procedure again in 6 puffs. 4.  peripheral vascular disease-status post right carotid endarterectomy 5. cerebral aneurysm-status post coil embolization- 2008    Tagan Bartram, Wonda Cheng, MD  10/02/2014 4:22 PM    [place],  [place], [place]  [address] Pager [phone] Phone: [phone]; Fax: [phone]   [facility]  [address] [place], [place]  [address] [phone]   Fax [phone]

## 2014-10-02 NOTE — Patient Instructions (Signed)

## 2014-10-09 DIAGNOSIS — M545 Low back pain: Secondary | ICD-10-CM | POA: Diagnosis not present

## 2014-10-09 DIAGNOSIS — F419 Anxiety disorder, unspecified: Secondary | ICD-10-CM | POA: Diagnosis not present

## 2014-10-23 ENCOUNTER — Encounter: Payer: Self-pay | Admitting: Physical Therapy

## 2014-10-23 ENCOUNTER — Ambulatory Visit: Payer: Commercial Managed Care - HMO | Attending: Family Medicine | Admitting: Physical Therapy

## 2014-10-23 DIAGNOSIS — M545 Low back pain, unspecified: Secondary | ICD-10-CM

## 2014-10-23 NOTE — Patient Instructions (Signed)
   Supine    Lie on back with one leg out straight. Support other leg behind knee. Slowly straighten knee. Hold 30___ seconds.  Repeat _2__ times per session. Do __1_ sessions per day.  Copyright  VHI. All rights reserved.  Knee-to-Chest Stretch: Unilateral    With hand behind right knee, pull knee in to chest until a comfortable stretch is felt in lower back and buttocks. Keep back relaxed. Hold _15___ seconds. Repeat ___2_ times per set. Do __1__ sets per session. Do __1__ sessions per day.  http://orth.exer.us/126   Copyright  VHI. All rights reserved.  Angry Cat Stretch    Tuck chin and tighten stomach, arching back. Repeat __10__ times per set. Do _1___ sets per session. Do _1___ sessions per day.  http://orth.exer.us/118   Copyright  VHI. All rights reserved.  [place] [address] [place], [place] [address] Phone # [phone] Fax [phone]

## 2014-10-23 NOTE — Therapy (Signed)
[facility] [address], [place], [place], [address] Phone: [phone]   Fax:  [phone]  Physical Therapy Evaluation  Patient Details  Name: [name] MRN: [id number] Date of Birth: 1930/12/22 Referring Provider: Darcus Austin  Encounter Date: 10/23/2014      PT End of Session - 10/23/14 0915    Visit Number 1   Number of Visits 10  Medicare   Date for PT Re-Evaluation 12/04/14   PT Start Time 0845   PT Stop Time 0915   PT Time Calculation (min) 30 min   Activity Tolerance Patient tolerated treatment well   Behavior During Therapy [facility] for tasks assessed/performed      Past Medical History  Diagnosis Date  . Coronary artery disease   . Hypertension   . Hyperlipemia   . S/P carotid endarterectomy     RIGHT CAROTID    Past Surgical History  Procedure Laterality Date  . Carotid endarterectomy    . Coronary angioplasty with stent placement      FIRST DIAGONAL    There were no vitals filed for this visit.  Visit Diagnosis:  Midline low back pain without sciatica - Plan: PT plan of care cert/re-cert      Subjective Assessment - 10/23/14 0849    Subjective Patient reports her back pain began 5 months ago while dragging a bag of mulch.    Limitations Standing   How long can you stand comfortably? sometimes increases back pain, stands bent over first thing in the morning   Patient Stated Goals be able to stand up straight   Currently in Pain? Yes   Pain Score 3    Pain Location Back   Pain Orientation Mid   Pain Type Acute pain   Pain Onset More than a month ago   Pain Frequency Intermittent   Aggravating Factors  first thing in the morning   Pain Relieving Factors middle of day            [facility] PT Assessment - 10/23/14 0001    Assessment   Medical Diagnosis M54.5 low back pain   Referring Provider Gates,Donna   Onset Date/Surgical Date 05/04/14   Prior Therapy None   Precautions   Precautions Other (comment)   Precaution Comments osteoporosis-no joint mobilization, no forward bending   Balance Screen   Has the patient fallen in the past 6 months No   Has the patient had a decrease in activity level because of a fear of falling?  No   Is the patient reluctant to leave their home because of a fear of falling?  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 hip abduction 3+/5   Flexibility   Soft Tissue Assessment /Muscle Length yes   Hamstrings tight bil.    Palpation   SI assessment  right ilium is anteriorly rotated   Palpation comment tenderness located in right sacral sulcus  PT Education - 03-Nov-2014 0915    Education provided Yes   Education Details hamstring stretch, SKC, cat camel   Person(s) Educated Patient   Methods Explanation;Demonstration;Verbal cues;Handout   Comprehension Returned demonstration;Verbalized understanding          PT Short Term Goals - 11-03-2014 0921    PT SHORT TERM GOAL #1   Title independent with initial HEP   Time 3   Period Weeks   Status New   PT SHORT TERM GOAL #2   Title understand correct posture in sittng and standing   Time 3   Period Weeks   Status New   PT SHORT TERM GOAL #3   Title pain in the morning decresaed >/= 25%   Time 3   Period Weeks   Status New           PT Long Term Goals - 11/03/2014 6761    PT LONG TERM GOAL #1   Title independent with HEP   Time 6   Period Weeks   Status New   PT LONG TERM GOAL #2   Title pain in the morning decreased >/= 75%   Time  6   Period Weeks   Status New   PT LONG TERM GOAL #3   Title ability to stand straight in the morning is >/= 75% greater ease   Time 6   Period Weeks   Status New               Plan - 2014-11-03 0917    Clinical Impression Statement Patient is a 81 year old female with diagnosis of low back pain.  Patient reports her pain began 5 months ago when she was dragging mulch. Patient repoer pain level is 3/10 in mid lumbar intermittently.  Patient reports her pain begins when she was up and unable to straighten up.  Lumbar extension decreased by 50%.  bil. hip abduction 3+/5.  right ilium is anteriory rotated.  Palpable tenderness located in right lumbar sacral area. Patient would benefit from physical therapy to reduce pain and improve the ability to stand straight.    Pt will benefit from skilled therapeutic intervention in order to improve on the following deficits Decreased activity tolerance;Decreased mobility;Decreased strength;Pain;Increased muscle spasms;Decreased range of motion   Rehab Potential Excellent   Clinical Impairments Affecting Rehab Potential None   PT Frequency 2x / week   PT Duration 6 weeks   PT Treatment/Interventions Cryotherapy;Electrical Stimulation;Moist Heat;Therapeutic exercise;Therapeutic activities;Ultrasound;Neuromuscular re-education;Patient/family education   PT Next Visit Plan soft tissue work to right lumbar, correct pelvis, bil. hip abd strength, back extensor strength, modalities as needed   PT Home Exercise Plan Posture   Recommended Other Services None   Consulted and Agree with Plan of Care Patient          G-Codes - 2014/11/03 0914    Functional Assessment Tool Used FOTO score is 41% limitation CK  goal is 37% limitation CJ   Functional Limitation Other PT primary   Other PT Primary Current Status (P5093) At least 40 percent but less than 60 percent impaired, limited or restricted   Other PT Primary Goal Status (O6712) At least 20 percent but  less than 40 percent impaired, limited or restricted       Problem List Patient Active Problem List   Diagnosis Date Noted  . Near syncope 06/01/2012  . Bruising 04/02/2010  . Coronary artery disease   . Hypertension   . Hyperlipemia   . S/P carotid  endarterectomy     Denys Salinger,PT 10/23/2014, 9:25 AM  [facility] [address], [place], [place], [address] Phone: [phone]   Fax:  [phone]  Name: ELEKTRA WARTMAN MRN: [id number] Date of Birth: 1930/02/24

## 2014-10-28 ENCOUNTER — Ambulatory Visit: Payer: Commercial Managed Care - HMO | Admitting: Physical Therapy

## 2014-10-28 DIAGNOSIS — M545 Low back pain, unspecified: Secondary | ICD-10-CM

## 2014-10-28 NOTE — Therapy (Signed)
[facility] [address] [place], [place], [address] Phone: [phone]   Fax:  [phone]  Physical Therapy Treatment  Patient Details  Name: [name] MRN: [id number] Date of Birth: 1930-02-21 Referring Provider: Darcus Austin  Encounter Date: 10/28/2014      PT End of Session - 10/28/14 1046    Visit Number 2   Number of Visits 10   Date for PT Re-Evaluation 12/04/14   PT Start Time 1010   PT Stop Time 1046   PT Time Calculation (min) 36 min   Activity Tolerance Patient tolerated treatment well   Behavior During Therapy [facility] for tasks assessed/performed      Past Medical History  Diagnosis Date  . Coronary artery disease   . Hypertension   . Hyperlipemia   . S/P carotid endarterectomy     RIGHT CAROTID    Past Surgical History  Procedure Laterality Date  . Carotid endarterectomy    . Coronary angioplasty with stent placement      FIRST DIAGONAL    There were no vitals filed for this visit.  Visit Diagnosis:  Midline low back pain without sciatica      Subjective Assessment - 10/28/14 1011    Subjective Pt has been performing HEP every day.  Some soreness but no increased pain.   Patient Stated Goals be able to stand up straight   Currently in Pain? No/denies                         [facility] Adult PT Treatment/Exercise - 10/28/14 0001    Exercises   Exercises Lumbar   Lumbar Exercises: Stretches   Single Knee to Chest Stretch 2 reps;30 seconds   Lumbar Exercises: Aerobic   Stationary Bike nustep level 2 x 6 minutes  seat 7, arms 9   Lumbar Exercises: Standing   Scapular Retraction Both;20 reps  with UEs straight   Lumbar Exercises: Sidelying   Clam 10 reps   Hip Abduction 10 reps   Lumbar Exercises: Prone   Straight Leg Raise 10 reps   Lumbar Exercises: Quadruped   Other Quadruped Lumbar Exercises cat/camel 3 seconds x 5   Manual Therapy   Manual Therapy Soft  tissue mobilization   Manual therapy comments soft tissuse mobilization to R lumbar and Bilat piriformis, pelvis realignment                PT Education - 10/28/14 1046    Education provided Yes   Education Details HEP for scap retract   Person(s) Educated Patient   Methods Explanation;Demonstration;Handout   Comprehension Verbalized understanding;Returned demonstration          PT Short Term Goals - 10/28/14 1047    PT SHORT TERM GOAL #1   Title independent with initial HEP   Status Achieved   PT SHORT TERM GOAL #2   Title understand correct posture in sittng and standing   Time 3   Period Weeks   Status On-going   PT SHORT TERM GOAL #3   Title pain in the morning decresaed >/= 25%   Time 3   Period Weeks   Status On-going           PT Long Term Goals - 10/28/14 1048    PT LONG TERM GOAL #1   Title independent with HEP   Time 6   Period Weeks   Status On-going   PT LONG TERM GOAL #2  Title pain in the morning decreased >/= 75%   Time 6   Period Weeks   Status On-going   PT LONG TERM GOAL #3   Title ability to stand straight in the morning is >/= 75% greater ease   Time 6   Period Weeks   Status On-going               Plan - 10/28/14 1047    Clinical Impression Statement Pt with much improvement after manual therapy, still with forward flexed posture, decreased pain.  Pt will benefti from skilled PT to increase strength and improve posture.   Pt will benefit from skilled therapeutic intervention in order to improve on the following deficits Decreased activity tolerance;Decreased mobility;Decreased strength;Pain;Increased muscle spasms;Decreased range of motion   Rehab Potential Excellent   PT Frequency 2x / week   PT Duration 6 weeks   PT Treatment/Interventions Cryotherapy;Electrical Stimulation;Moist Heat;Therapeutic exercise;Therapeutic activities;Ultrasound;Neuromuscular re-education;Patient/family education        Problem  List Patient Active Problem List   Diagnosis Date Noted  . Near syncope 06/01/2012  . Bruising 04/02/2010  . Coronary artery disease   . Hypertension   . Hyperlipemia   . S/P carotid endarterectomy     Isabelle Course, PT, DPT  10/28/2014, 10:50 AM  [facility] [address] [place], [place], [address] Phone: [phone]   Fax:  [phone]  Name: [name] MRN: [id number] Date of Birth: 02/15/1930

## 2014-10-30 ENCOUNTER — Encounter: Payer: Self-pay | Admitting: Physical Therapy

## 2014-10-30 ENCOUNTER — Ambulatory Visit: Payer: Commercial Managed Care - HMO | Admitting: Physical Therapy

## 2014-10-30 DIAGNOSIS — M545 Low back pain, unspecified: Secondary | ICD-10-CM

## 2014-10-30 NOTE — Therapy (Signed)
[facility] [address] [place], [place], [address] Phone: [phone]   Fax:  [phone]  Physical Therapy Treatment  Patient Details  Name: [name] MRN: [id number] Date of Birth: 1930-11-05 Referring Provider: Darcus Austin  Encounter Date: 10/30/2014      PT End of Session - 10/30/14 0946    Visit Number 3   Number of Visits 10   Date for PT Re-Evaluation 12/04/14   PT Start Time 0929   PT Stop Time 1015   PT Time Calculation (min) 46 min   Activity Tolerance Patient tolerated treatment well   Behavior During Therapy [facility] for tasks assessed/performed      Past Medical History  Diagnosis Date  . Coronary artery disease   . Hypertension   . Hyperlipemia   . S/P carotid endarterectomy     RIGHT CAROTID    Past Surgical History  Procedure Laterality Date  . Carotid endarterectomy    . Coronary angioplasty with stent placement      FIRST DIAGONAL    There were no vitals filed for this visit.  Visit Diagnosis:  Midline low back pain without sciatica      Subjective Assessment - 10/30/14 0942    Subjective Pt reports noticed imporved trunk extension. No complain of pain   Currently in Pain? No/denies                         [facility] Adult PT Treatment/Exercise - 10/30/14 0001    Exercises   Exercises Lumbar   Lumbar Exercises: Stretches   Single Knee to Chest Stretch 2 reps   Lower Trunk Rotation 3 reps;20 seconds   Standing Extension --  McKenzie extension x 10   Lumbar Exercises: Aerobic   Stationary Bike nustep level 2 x 6 minutes  seat #7, arms #9, 0.84miles   Lumbar Exercises: Supine   Other Supine Lumbar Exercises Foam roll x 3 min for elongation and decompression   Manual Therapy   Manual Therapy Soft tissue mobilization   Manual therapy comments soft tissuse mobilization to R lumbar and Bilat piriformis                  PT Short Term Goals - 10/28/14  1047    PT SHORT TERM GOAL #1   Title independent with initial HEP   Status Achieved   PT SHORT TERM GOAL #2   Title understand correct posture in sittng and standing   Time 3   Period Weeks   Status On-going   PT SHORT TERM GOAL #3   Title pain in the morning decresaed >/= 25%   Time 3   Period Weeks   Status On-going           PT Long Term Goals - 10/28/14 1048    PT LONG TERM GOAL #1   Title independent with HEP   Time 6   Period Weeks   Status On-going   PT LONG TERM GOAL #2   Title pain in the morning decreased >/= 75%   Time 6   Period Weeks   Status On-going   PT LONG TERM GOAL #3   Title ability to stand straight in the morning is >/= 75% greater ease   Time 6   Period Weeks   Status On-going               Plan - 10/30/14 0947    Clinical Impression  Statement Pt with good performance of stretching exercises, and tolerates new activites for thoracic extension and flexibility well. pt will continue to benefit from skilled PT to improve flexibility of spine to improve posture.   Pt will benefit from skilled therapeutic intervention in order to improve on the following deficits Decreased activity tolerance;Decreased mobility;Decreased strength;Pain;Increased muscle spasms;Decreased range of motion   Rehab Potential Excellent   Clinical Impairments Affecting Rehab Potential None   PT Frequency 2x / week   PT Duration 6 weeks   PT Treatment/Interventions Cryotherapy;Electrical Stimulation;Moist Heat;Therapeutic exercise;Therapeutic activities;Ultrasound;Neuromuscular re-education;Patient/family education   PT Next Visit Plan soft tissue work to right lumbar, correct pelvis, bil. hip abd strength, back extensor strength, modalities as needed   PT Home Exercise Plan Posture   Consulted and Agree with Plan of Care Patient        Problem List Patient Active Problem List   Diagnosis Date Noted  . Near syncope 06/01/2012  . Bruising 04/02/2010  .  Coronary artery disease   . Hypertension   . Hyperlipemia   . S/P carotid endarterectomy     NAUMANN-HOUEGNIFIO,Jarrod Bodkins PTA 10/30/2014, 10:28 AM  [facility] [address] [place], [place], [address] Phone: [phone]   Fax:  [phone]  Name: [name] MRN: [id number] Date of Birth: 1930-04-07

## 2014-11-03 ENCOUNTER — Ambulatory Visit: Payer: Commercial Managed Care - HMO | Admitting: Physical Therapy

## 2014-11-03 ENCOUNTER — Encounter: Payer: Self-pay | Admitting: Physical Therapy

## 2014-11-03 DIAGNOSIS — M545 Low back pain, unspecified: Secondary | ICD-10-CM

## 2014-11-03 NOTE — Therapy (Signed)
[facility] [address] [place], [place], [address] Phone: [phone]   Fax:  [phone]  Physical Therapy Treatment  Patient Details  Name: [name] MRN: [id number] Date of Birth: [birth year] Referring Provider: Dr. Darcus Austin  Encounter Date: 11/03/2014      PT End of Session - 11/03/14 0931    Visit Number 4   Number of Visits 10  medicare   Date for PT Re-Evaluation 12/04/14   PT Start Time 0930   PT Stop Time 1010   PT Time Calculation (min) 40 min   Activity Tolerance Patient tolerated treatment well   Behavior During Therapy [facility] for tasks assessed/performed      Past Medical History  Diagnosis Date  . Coronary artery disease   . Hypertension   . Hyperlipemia   . S/P carotid endarterectomy     RIGHT CAROTID    Past Surgical History  Procedure Laterality Date  . Carotid endarterectomy    . Coronary angioplasty with stent placement      FIRST DIAGONAL    There were no vitals filed for this visit.  Visit Diagnosis:  Midline low back pain without sciatica      Subjective Assessment - 11/03/14 0931    Subjective My back was bothering me this morining. Patient reports she can stand and walk longer without being bent over.  My husband says I am standing straighter. I want to be discharged to HEP due to co-pay being so high.    Limitations Standing   How long can you stand comfortably? sometimes increases back pain, stands bent over first thing in the morning   Patient Stated Goals be able to stand up straight   Currently in Pain? Yes   Pain Score 4    Pain Location Back   Pain Orientation Mid   Pain Descriptors / Indicators Dull   Pain Type Acute pain   Pain Onset More than a month ago   Pain Frequency Intermittent   Aggravating Factors  first thing in the morning   Pain Relieving Factors middle of the day   Multiple Pain Sites No            [facility] PT Assessment - 11/03/14 0001     Assessment   Medical Diagnosis M54.5 low back pain   Referring Provider Dr. Darcus Austin   Onset Date/Surgical Date 05/04/14   Prior Therapy None   Precautions   Precautions Other (comment)   Precaution Comments osteoporosis-no joint mobilization, no forward bending   Balance Screen   Has the patient fallen in the past 6 months No   Has the patient had a decrease in activity level because of a fear of falling?  No   Is the patient reluctant to leave their home because of a fear of falling?  No   Prior Function   Level of Independence Independent   Vocation Retired   Associate Professor   Overall Cognitive Status Within Functional Limits for tasks assessed   Observation/Other Assessments   Focus on Therapeutic Outcomes (FOTO)  32% limitation   Strength   Overall Strength Comments right hip abd 4/5, left hip abd 3+/5   Flexibility   Soft Tissue Assessment /Muscle Length yes   Hamstrings tight bil.    Palpation   SI assessment  pelvis in correct alignment   Palpation comment tenderness located in right sacral sulcus  [facility] Adult PT Treatment/Exercise - 11/25/14 0001    Lumbar Exercises: Stretches   Single Knee to Chest Stretch 2 reps   Lower Trunk Rotation 3 reps;20 seconds   Lumbar Exercises: Aerobic   Stationary Bike nustep level 2 x 6 minutes  seat #7, arms #9, 0.53mles   Lumbar Exercises: Standing   Other Standing Lumbar Exercises standing hip abduction 10x bil.    Lumbar Exercises: Sidelying   Clam 5 reps;Other (comment)  left but caused back pain                PT Education - [id number]    Education provided Yes   Education Details Body mechanics with daily activities, posture, back strengthening   Person(s) Educated Patient   Methods Explanation;Demonstration;Handout   Comprehension Returned demonstration;Verbalized understanding          PT Short Term Goals - [id number]    PT SHORT TERM GOAL #1   Title independent  with initial HEP   Time 3   Period Weeks   Status Achieved   PT SHORT TERM GOAL #2   Title understand correct posture in sittng and standing   Time 3   Period Weeks   Status Achieved   PT SHORT TERM GOAL #3   Title pain in the morning decresaed >/= 25%   Time 3   Period Weeks   Status Achieved  50% better           PT Long Term Goals - [id number]/11/[id number]   PT LONG TERM GOAL #1   Title independent with HEP   Time 6   Period Weeks   Status Achieved   PT LONG TERM GOAL #2   Title pain in the morning decreased >/= 75%   Time 6   Period Weeks   Status Partially Met  50% better   PT LONG TERM GOAL #3   Title ability to stand straight in the morning is >/= 75% greater ease   Time 6   Period Weeks   Status Achieved  80% better               Plan - 111/22/[id number]    Clinical Impression Statement Patient is a 81year old female with diagnosis of low back pain. FOTO score is 32% limitation. Patient has met her LTG # 1 and 3.  Patient reports it is 80% easier to stand up straight in the morning.  Patient reports pain decreased by 50%.  Patient wonated to be discharged due to high copay.    Pt will benefit from skilled therapeutic intervention in order to improve on the following deficits Decreased activity tolerance;Decreased mobility;Decreased strength;Pain;Increased muscle spasms;Decreased range of motion   Rehab Potential Excellent   Clinical Impairments Affecting Rehab Potential None   PT Treatment/Interventions Cryotherapy;Electrical Stimulation;Moist Heat;Therapeutic exercise;Therapeutic activities;Ultrasound;Neuromuscular re-education;Patient/family education   PT Next Visit Plan Discharge to HEP   PT Home Exercise Plan Current HEP   Consulted and Agree with Plan of Care Patient          G-Codes - [id number]    Functional Assessment Tool Used FOTO score is 32% limitaiton   Functional Limitation Other PT primary   Other PT Primary Goal Status (([id number] At least  20 percent but less than 40 percent impaired, limited or restricted   Other PT Primary Discharge Status (([id number] At least 20 percent but less than 40 percent impaired, limited or restricted      Problem List Patient  Active Problem List   Diagnosis Date Noted  . Near syncope 06/01/2012  . Bruising 04/02/2010  . Coronary artery disease   . Hypertension   . Hyperlipemia   . S/P carotid endarterectomy     Facundo Allemand 11/03/2014, 10:12 AM  [facility] [address], [place], [place], [address] Phone: [phone]   Fax:  [phone]  Name: [name] MRN: [id number] Date of Birth: May 29, 1930    PHYSICAL THERAPY DISCHARGE SUMMARY  Visits from Start of Care: 4  Current functional level related to goals / functional outcomes: See above.  Patient wanted to be discharged due to finances.   Remaining deficits: See above.  Education / Equipment: HEP  Plan: Patient agrees to discharge.  Patient goals were partially met. Patient is being discharged due to the patient's request. Thank you for the referral. Earlie Counts, PT 11/03/2014 10:14 AM   ?????

## 2014-11-03 NOTE — Patient Instructions (Signed)
ABDUCTION: Standing (Active)    Stand, feet flat. Lift right leg out to side. Use _0__ lbs. Complete _1__ sets of _10__ repetitions. Perform __1_ sessions per day.  http://gtsc.exer.us/111   Copyright  VHI. All rights reserved.   Hip Extension (All-Fours)    Lift right leg back with knee slightly flexed. Do not arch neck or back. Repeat __10__ times per set. Do __1__ sets per session. Do __1__ sessions per day.  http://orth.exer.us/106   Copyright  VHI. All rights reserved.  Cat / Cow Flow    Inhale, press spine toward ceiling like a Halloween cat. Keeping strength in arms and abdominals, exhale to soften spine through neutral and into cow pose. Open chest and arch back. Initiate movement between cat and cow at tailbone, one vertebrae at a time. Repeat __10__ times.  Copyright  VHI. All rights reserved.  Knee-to-Chest Stretch: Unilateral    With hand behind right knee, pull knee in to chest until a comfortable stretch is felt in lower back and buttocks. Keep back relaxed. Hold __15__ seconds. Repeat __2__ times per set. Do __1__ sets per session. Do __1__ sessions per day.  http://orth.exer.us/126   Copyright  VHI. All rights reserved.  Chair Sitting    Sit at edge of seat, spine straight, one leg extended. Put a hand on each thigh and bend forward from the hip, keeping spine straight. Allow hand on extended leg to reach toward toes. Support upper body with other arm. Hold _30__ seconds. Repeat _2__ times per session. Do _1__ sessions per day.  Copyright  VHI. All rights reserved.  Posture - Sitting    Sit upright, head facing forward. Try using a roll to support lower back. Keep shoulders relaxed, and avoid rounded back. Keep hips level with knees. Avoid crossing legs for long periods.   Copyright  VHI. All rights reserved.  Posture - Standing    Good posture is important. Avoid slouching and forward head thrust. Maintain curve in low back and align  ears over shoul- ders, hips over ankles.   Copyright  VHI. All rights reserved.  Sleeping on Side    Place pillow between knees. Use cervical support under neck and a roll around waist as needed.   Copyright  VHI. All rights reserved.  Car    Before driving, adjust seat and steering (if tilt control) to ensure good posture. [place] and a lumbar roll can be used for positioning, whether riding or driving.  Copyright  VHI. All rights reserved.  Lifting Principles  .Maintain proper posture and head alignment. .Slide object as close as possible before lifting. .Move obstacles out of the way. .Test before lifting; ask for help if too heavy. .Tighten stomach muscles without holding breath. .Use smooth movements; do not jerk. .Use legs to do the work, and pivot with feet. .Distribute the work load symmetrically and close to the center of trunk. .Push instead of pull whenever possible.  Copyright  VHI. All rights reserved.  Housework - Sweeping    Use long-handled equipment to avoid stooping.   Copyright  VHI. All rights reserved.  [facility] [address] [place], [place] [address] Phone # [phone] Fax [phone]

## 2014-11-05 ENCOUNTER — Encounter: Payer: Commercial Managed Care - HMO | Admitting: Physical Therapy

## 2014-11-13 ENCOUNTER — Other Ambulatory Visit: Payer: Self-pay | Admitting: Cardiovascular Disease

## 2014-11-13 MED ORDER — HYDROCHLOROTHIAZIDE 25 MG PO TABS: 25.0000 mg | ORAL_TABLET | Freq: Every day | ORAL | Status: DC

## 2014-11-13 MED ORDER — LISINOPRIL 40 MG PO TABS: 40.0000 mg | ORAL_TABLET | Freq: Every day | ORAL | Status: DC

## 2014-11-18 ENCOUNTER — Other Ambulatory Visit: Payer: Self-pay

## 2014-11-18 MED ORDER — POTASSIUM CHLORIDE ER 10 MEQ PO CPCR
10.0000 meq | ORAL_CAPSULE | Freq: Every day | ORAL | Status: DC
Start: 2014-11-18 — End: 2015-04-23

## 2015-01-08 ENCOUNTER — Other Ambulatory Visit (HOSPITAL_COMMUNITY): Payer: Self-pay | Admitting: Interventional Radiology

## 2015-01-08 DIAGNOSIS — I729 Aneurysm of unspecified site: Secondary | ICD-10-CM

## 2015-01-09 ENCOUNTER — Emergency Department (HOSPITAL_COMMUNITY)
Admission: EM | Admit: 2015-01-09 | Discharge: 2015-01-09 | Disposition: A | Discharge status: Home / Self Care | Payer: Commercial Managed Care - HMO | Attending: Emergency Medicine | Admitting: Emergency Medicine

## 2015-01-09 ENCOUNTER — Other Ambulatory Visit: Payer: Self-pay | Admitting: Cardiology

## 2015-01-09 ENCOUNTER — Encounter (HOSPITAL_COMMUNITY): Payer: Self-pay | Admitting: *Deleted

## 2015-01-09 ENCOUNTER — Telehealth: Payer: Self-pay | Admitting: Cardiovascular Disease

## 2015-01-09 ENCOUNTER — Emergency Department (HOSPITAL_COMMUNITY): Payer: Commercial Managed Care - HMO

## 2015-01-09 DIAGNOSIS — E785 Hyperlipidemia, unspecified: Secondary | ICD-10-CM | POA: Diagnosis not present

## 2015-01-09 DIAGNOSIS — F419 Anxiety disorder, unspecified: Secondary | ICD-10-CM | POA: Insufficient documentation

## 2015-01-09 DIAGNOSIS — R079 Chest pain, unspecified: Secondary | ICD-10-CM | POA: Diagnosis not present

## 2015-01-09 DIAGNOSIS — Z79899 Other long term (current) drug therapy: Secondary | ICD-10-CM | POA: Diagnosis not present

## 2015-01-09 DIAGNOSIS — H52203 Unspecified astigmatism, bilateral: Secondary | ICD-10-CM | POA: Diagnosis not present

## 2015-01-09 DIAGNOSIS — I2510 Atherosclerotic heart disease of native coronary artery without angina pectoris: Secondary | ICD-10-CM | POA: Diagnosis not present

## 2015-01-09 DIAGNOSIS — H1851 Endothelial corneal dystrophy: Secondary | ICD-10-CM | POA: Diagnosis not present

## 2015-01-09 DIAGNOSIS — I10 Essential (primary) hypertension: Secondary | ICD-10-CM | POA: Diagnosis not present

## 2015-01-09 DIAGNOSIS — Z9861 Coronary angioplasty status: Secondary | ICD-10-CM | POA: Diagnosis not present

## 2015-01-09 DIAGNOSIS — H26491 Other secondary cataract, right eye: Secondary | ICD-10-CM | POA: Diagnosis not present

## 2015-01-09 DIAGNOSIS — Z7982 Long term (current) use of aspirin: Secondary | ICD-10-CM | POA: Insufficient documentation

## 2015-01-09 DIAGNOSIS — Z961 Presence of intraocular lens: Secondary | ICD-10-CM | POA: Diagnosis not present

## 2015-01-09 DIAGNOSIS — R072 Precordial pain: Secondary | ICD-10-CM | POA: Diagnosis not present

## 2015-01-09 LAB — I-STAT TROPONIN, ED: Troponin i, poc: 0 ng/mL (ref 0.00–0.08)

## 2015-01-09 LAB — BASIC METABOLIC PANEL
Anion gap: 11 (ref 5–15)
BUN: 15 mg/dL (ref 6–20)
CO2: 24 mmol/L (ref 22–32)
Calcium: 10.1 mg/dL (ref 8.9–10.3)
Chloride: 104 mmol/L (ref 101–111)
Creatinine, Ser: 1.04 mg/dL — ABNORMAL HIGH (ref 0.44–1.00)
GFR calc Af Amer: 56 mL/min — ABNORMAL LOW (ref >60)
GFR calc non Af Amer: 49 mL/min — ABNORMAL LOW (ref >60)
Glucose, Bld: 125 mg/dL — ABNORMAL HIGH (ref 65–99)
Potassium: 3.9 mmol/L (ref 3.5–5.1)
Sodium: 139 mmol/L (ref 135–145)

## 2015-01-09 LAB — CBC
HCT: 42.2 % (ref 36.0–46.0)
Hemoglobin: 14.1 g/dL (ref 12.0–15.0)
MCH: 30.1 pg (ref 26.0–34.0)
MCHC: 33.4 g/dL (ref 30.0–36.0)
MCV: 90.2 fL (ref 78.0–100.0)
Platelets: 211 10*3/uL (ref 150–400)
RBC: 4.68 MIL/uL (ref 3.87–5.11)
RDW: 13 % (ref 11.5–15.5)
WBC: 6.7 10*3/uL (ref 4.0–10.5)

## 2015-01-09 MED ORDER — NITROGLYCERIN 0.4 MG SL SUBL: 0.4000 mg | SUBLINGUAL_TABLET | SUBLINGUAL | Status: DC | PRN

## 2015-01-09 MED ORDER — ASPIRIN 81 MG PO CHEW
324.0000 mg | CHEWABLE_TABLET | Freq: Once | ORAL | Status: AC
Start: 2015-01-09 — End: 2015-01-09
  Administered 2015-01-09: 324 mg via ORAL
  Filled 2015-01-09: qty 4

## 2015-01-09 NOTE — Telephone Encounter (Signed)
New problem   Pt stated she has been under a lot of stress and having experiencing heaviness in her chest and gas. Pt want a call back from nurse to possibly get an EKG. Please call pt.

## 2015-01-09 NOTE — H&P (Deleted)
Patient ID: ARADIA TORMEY MRN: [id number], DOB/AGE: Aug 26, 1930   Admit date: 01/09/2015   Primary Physician: [name], MD Primary Cardiologist: Dr. Acie Fredrickson  Pt. Profile:  82 year old female with known coronary disease as well as a history of hypertension hyperlipidemia and peripheral vascular disease presenting to the ED with complaint of chest pain.  Problem List  Past Medical History  Diagnosis Date  . Coronary artery disease   . Hypertension   . Hyperlipemia   . S/P carotid endarterectomy     RIGHT CAROTID    Past Surgical History  Procedure Laterality Date  . Carotid endarterectomy    . Coronary angioplasty with stent placement      FIRST DIAGONAL     Allergies  Allergies  Allergen Reactions  . Bystolic [Nebivolol Hcl] Other (See Comments)    Mouth sores  . Codeine Other (See Comments)    Strange feeling per patient    HPI  The patient is a 82 year old female, followed by Dr. Acie Fredrickson, who presents to the [facility] ED with a complaint of chest pain. Her past cardiac history is significant for CAD status post PTCA cutting balloon to the first diagonal, hypertension, hyperlipidemia, peripheral vascular disease status post right carotid endarterectomy as well as a history of cerebral aneurysm status post coil embolization in 2008. Intervention to the first diagonal was in 2002.  groin and he and At that time she was also noted to have mild LAD disease with 30-40% stenosis in the midportion. EF at that time was 65-70%.  She reports the development of chest pain several weeks ago. It has been intermittent occurring off and on. Substernal. Non-radiating. Feels like indigestion. It is somewhat similar to her angina she had prior to undergoing PCI in 2012. Occurs at rest but she hasn't noticed any exertional components. No relationship with meals. It has been relieved with belching and she has also experienced some relief with Gas-X. She has not tried sublingual  nitroglycerin at home. She denies any associated symptoms. No dyspnea, diaphoresis, nausea vomiting, syncope/near-syncope. She reports full medication compliance at home. She reports that she has been under a great deal of stress at home. Her husband has been sick and she is also the caretaker of her disabled daughter.   She reports that she went to see her eye doctor today and mentioned her recent symptoms. Her eye doctor advised that she check in with our office to report her symptoms. She called our office today and spoke with Dr. [name] recommended that she come to the ED for evaluation.  In the ED, EKG shows sinus rhythm with PACs. POC troponin is negative. CBC and BMP are both unremarkable. Serum creatinine is 1.04. Chest x-ray is unremarkable. She is currently CP free.   Home Medications  Prior to Admission medications   Medication Sig Start Date End Date Taking? Authorizing Provider  ALPRAZolam [name]) 0.5 MG tablet Take 0.5 mg by mouth at bedtime as needed for sleep (may take two tablets if needed).     Historical Provider, MD  aspirin EC 81 MG tablet Take 81 mg by mouth once. 11/15/11   [name], MD  atorvastatin (LIPITOR) 40 MG tablet Take 1 tablet (40 mg total) by mouth daily. 02/12/14   [name], MD  Calcium Carbonate-Vitamin D (CALTRATE 600+D PO) Take by mouth 2 (two) times daily.     Historical Provider, MD  hydrochlorothiazide (HYDRODIURIL) 25 MG tablet Take 1 tablet (25 mg total) by  mouth daily. 11/13/14   [name], MD  lisinopril (PRINIVIL,ZESTRIL) 40 MG tablet Take 1 tablet (40 mg total) by mouth daily. 11/13/14   [name], MD  potassium chloride (MICRO-K) 10 MEQ CR capsule Take 1 capsule (10 mEq total) by mouth daily. 11/18/14   [name], MD  valACYclovir (VALTREX) 1000 MG tablet Take 1,000 mg by mouth as needed (1 tablet daily as needed for breakouts).     Historical Provider, MD    Family History  Family History  Problem Relation  Age of Onset  . Stroke Father     DECEASED  . Stroke Mother     DECEASED  . Hypertension Mother     DECEASED  . Stroke Brother     Social History  Social History   Social History  . Marital Status: Married    Spouse Name: N/A  . Number of Children: N/A  . Years of Education: N/A   Occupational History  . Not on file.   Social History Main Topics  . Smoking status: Never Smoker   . Smokeless tobacco: Not on file  . Alcohol Use: No  . Drug Use: Not on file  . Sexual Activity: Not on file   Other Topics Concern  . Not on file   Social History Narrative     Review of Systems General:  No chills, fever, night sweats or weight changes.  Cardiovascular:  No chest pain, dyspnea on exertion, edema, orthopnea, palpitations, paroxysmal nocturnal dyspnea. Dermatological: No rash, lesions/masses Respiratory: No cough, dyspnea Urologic: No hematuria, dysuria Abdominal:   No nausea, vomiting, diarrhea, bright red blood per rectum, melena, or hematemesis Neurologic:  No visual changes, wkns, changes in mental status. All other systems reviewed and are otherwise negative except as noted above.  Physical Exam  Blood pressure 168/62, pulse 77, temperature 97.8 F (36.6 C), temperature source Oral, resp. rate 18, SpO2 100 %.  General: Pleasant, NAD Psych: Normal affect. Neuro: Alert and oriented X 3. Moves all extremities spontaneously. HEENT: Normal  Neck: Supple without bruits or JVD. Lungs:  Resp regular and unlabored, CTA. Heart: RRR no s3, s4, or murmurs. Abdomen: Soft, non-tender, non-distended, BS + x 4.  Extremities: No clubbing, cyanosis or edema. DP/PT/Radials 2+ and equal bilaterally.  Labs  Troponin [facility] of Care Test)  Recent Labs  01/09/15 1250  TROPIPOC 0.00   No results for input(s): CKTOTAL, CKMB, TROPONINI in the last 72 hours. Lab Results  Component Value Date   WBC 6.7 01/09/2015   HGB 14.1 01/09/2015   HCT 42.2 01/09/2015   MCV 90.2  01/09/2015   PLT 211 01/09/2015    Recent Labs Lab 01/09/15 1242  NA 139  K 3.9  CL 104  CO2 24  BUN 15  CREATININE 1.04*  CALCIUM 10.1  GLUCOSE 125*   Lab Results  Component Value Date   CHOL 131 05/26/2014   HDL 64.20 05/26/2014   LDLCALC 57 05/26/2014   TRIG 51.0 05/26/2014   No results found for: DDIMER   Radiology/Studies  Dg Chest 2 View  01/09/2015  CLINICAL DATA:  Chest pain. EXAM: CHEST  2 VIEW COMPARISON:  11/16/2006 FINDINGS: Heart size and pulmonary vascularity are normal and the lungs are clear. No effusions. No acute osseous abnormality. Diffuse degenerative changes in the spine with a slight thoracolumbar scoliosis. Chronic rotator cuff disease at both shoulders. IMPRESSION: No acute disease in the chest. Electronically Signed   By: Lorriane Shire M.D.  On: 01/09/2015 14:06    ECG  Sinus rhythm with PACs, chronic RBBB and    ASSESSMENT AND PLAN   1. Chest pain: EKG is unchanged. Tropoin is negative. She is currently CP free. Will will plan to release from ED and will arrange for an outpatient myoview in our office next week. Also ? Possible GI etiology or anxiety related due to stress at home as she cares for her husband and daughter. Add omeprazole for possible GERD. Add SL NTG PRN.   2. HTN: BP is moderately elevated. Continue home lisinopril, HCTZ.  3. HLD: continue stain.    Signed, Vincente Asbridge, Silas Flood, PA-C 01/09/2015, 4:03 PM As always negative.

## 2015-01-09 NOTE — Telephone Encounter (Signed)
Spoke with patient who states over last 2-3 weeks she has experienced heaviness in middle of chest; states she belches a lot; had 1 episode during church about 3 weeks ago where she got a funny feeling in chest that radiated down her left arm, lasting 15 minutes.  She states she did get diaphoretic at that time.  States the feeling resolved on its own.  States has been under a great deal of stress recently taking care of husband and daughter; Denies SOB, denies n/v, diaphoresis.  She does not have NTG.  She states she has not been doing any strenuous activity so she does not know if symptoms worsen with activity.  She has hx CAD.  She states she does I advised her that Dr. Acie Fredrickson is working in the hospital today and I will review with him for advice.  She verbalized understanding and agreement.

## 2015-01-09 NOTE — Consult Note (Signed)
Patient ID: [name] MRN: [id number], DOB/AGE: May 22, 1930   Admit date: 01/09/2015   Primary Physician: Heather Smolder, MD Primary Cardiologist: [name]  Pt. Profile:  82 year old female with known coronary disease as well as a history of hypertension hyperlipidemia and peripheral vascular disease presenting to the ED with complaint of chest pain.  Problem List  Past Medical History  Diagnosis Date  . Coronary artery disease   . Hypertension   . Hyperlipemia   . S/P carotid endarterectomy     RIGHT CAROTID    Past Surgical History  Procedure Laterality Date  . Carotid endarterectomy    . Coronary angioplasty with stent placement      FIRST DIAGONAL     Allergies  Allergies  Allergen Reactions  . Bystolic [Nebivolol Hcl] Other (See Comments)    Mouth sores  . Codeine Other (See Comments)    Strange feeling per patient    HPI  The patient is a 82 year old female, followed by [name], who presents to the [name] with a complaint of chest pain. Her past cardiac history is significant for CAD status post PTCA cutting balloon to the first diagonal, hypertension, hyperlipidemia, peripheral vascular disease status post right carotid endarterectomy as well as a history of cerebral aneurysm status post coil embolization in 2008. Intervention to the first diagonal was in 2002.  groin and he and At that time she was also noted to have mild LAD disease with 30-40% stenosis in the midportion. EF at that time was 65-70%.  She reports the development of chest pain several weeks ago. It has been intermittent occurring off and on. Substernal. Non-radiating. Feels like indigestion. It is somewhat similar to her angina she had prior to undergoing PCI in 2012. Occurs at rest but she hasn't noticed any exertional components. No relationship with meals. It has been relieved with belching and she has also experienced some relief with Gas-X. She has not tried sublingual  nitroglycerin at home. She denies any associated symptoms. No dyspnea, diaphoresis, nausea vomiting, syncope/near-syncope. She reports full medication compliance at home. She reports that she has been under a great deal of stress at home. Her husband has been sick and she is also the caretaker of her disabled daughter.   She reports that she went to see her eye doctor today and mentioned her recent symptoms. Her eye doctor advised that she check in with our office to report her symptoms. She called our office today and spoke with [name] nurse recommended that she come to the ED for evaluation.  In the ED, EKG shows sinus rhythm with PACs. POC troponin is negative. CBC and BMP are both unremarkable. Serum creatinine is 1.04. Chest x-ray is unremarkable. She is currently CP free.   Home Medications  Prior to Admission medications   Medication Sig Start Date End Date Taking? Authorizing Provider  ALPRAZolam [name]) 0.5 MG tablet Take 0.5 mg by mouth at bedtime as needed for sleep (may take two tablets if needed).     Historical Provider, MD  aspirin EC 81 MG tablet Take 81 mg by mouth once. 11/15/11   Heather Headings, MD  atorvastatin (LIPITOR) 40 MG tablet Take 1 tablet (40 mg total) by mouth daily. 02/12/14   Heather Headings, MD  Calcium Carbonate-Vitamin D (CALTRATE 600+D PO) Take by mouth 2 (two) times daily.     Historical Provider, MD  hydrochlorothiazide (HYDRODIURIL) 25 MG tablet Take 1 tablet (25 mg total) by mouth  daily. 11/13/14   Heather Headings, MD  lisinopril (PRINIVIL,ZESTRIL) 40 MG tablet Take 1 tablet (40 mg total) by mouth daily. 11/13/14   Heather Headings, MD  potassium chloride (MICRO-K) 10 MEQ CR capsule Take 1 capsule (10 mEq total) by mouth daily. 11/18/14   Heather Headings, MD  valACYclovir (VALTREX) 1000 MG tablet Take 1,000 mg by mouth as needed (1 tablet daily as needed for breakouts).     Historical Provider, MD    Family History  Family History  Problem Relation  Age of Onset  . Stroke Father     DECEASED  . Stroke Mother     DECEASED  . Hypertension Mother     DECEASED  . Stroke Brother     Social History  Social History   Social History  . Marital Status: Married    Spouse Name: N/A  . Number of Children: N/A  . Years of Education: N/A   Occupational History  . Not on file.   Social History Main Topics  . Smoking status: Never Smoker   . Smokeless tobacco: Not on file  . Alcohol Use: No  . Drug Use: Not on file  . Sexual Activity: Not on file   Other Topics Concern  . Not on file   Social History Narrative     Review of Systems General:  No chills, fever, night sweats or weight changes.  Cardiovascular:  No chest pain, dyspnea on exertion, edema, orthopnea, palpitations, paroxysmal nocturnal dyspnea. Dermatological: No rash, lesions/masses Respiratory: No cough, dyspnea Urologic: No hematuria, dysuria Abdominal:   No nausea, vomiting, diarrhea, bright red blood per rectum, melena, or hematemesis Neurologic:  No visual changes, wkns, changes in mental status. All other systems reviewed and are otherwise negative except as noted above.  Physical Exam  Blood pressure 168/62, pulse 77, temperature 97.8 F (36.6 C), temperature source Oral, resp. rate 18, SpO2 100 %.  General: Pleasant, NAD Psych: Normal affect. Neuro: Alert and oriented X 3. Moves all extremities spontaneously. HEENT: Normal  Neck: Supple without bruits or JVD. Lungs:  Resp regular and unlabored, CTA. Heart: RRR no s3, s4, or murmurs. Abdomen: Soft, non-tender, non-distended, BS + x 4.  Extremities: No clubbing, cyanosis or edema. DP/PT/Radials 2+ and equal bilaterally.  Labs  Troponin [facility] Test)  Recent Labs  01/09/15 1250  TROPIPOC 0.00   No results for input(s): CKTOTAL, CKMB, TROPONINI in the last 72 hours. Lab Results  Component Value Date   WBC 6.7 01/09/2015   HGB 14.1 01/09/2015   HCT 42.2 01/09/2015   MCV 90.2  01/09/2015   PLT 211 01/09/2015    Recent Labs Lab 01/09/15 1242  NA 139  K 3.9  CL 104  CO2 24  BUN 15  CREATININE 1.04*  CALCIUM 10.1  GLUCOSE 125*   Lab Results  Component Value Date   CHOL 131 05/26/2014   HDL 64.20 05/26/2014   LDLCALC 57 05/26/2014   TRIG 51.0 05/26/2014   No results found for: DDIMER   Radiology/Studies  Dg Chest 2 View  01/09/2015  CLINICAL DATA:  Chest pain. EXAM: CHEST  2 VIEW COMPARISON:  11/16/2006 FINDINGS: Heart size and pulmonary vascularity are normal and the lungs are clear. No effusions. No acute osseous abnormality. Diffuse degenerative changes in the spine with a slight thoracolumbar scoliosis. Chronic rotator cuff disease at both shoulders. IMPRESSION: No acute disease in the chest. Electronically Signed   By: Lorriane Shire M.D.   On:  01/09/2015 14:06    ECG  Sinus rhythm with PACs, chronic RBBB and    ASSESSMENT AND PLAN   1. Chest pain: EKG is unchanged. Tropoin is negative. She is currently CP free. Will will plan to release from ED and will arrange for an outpatient myoview in our office next week. Also ? Possible GI etiology or anxiety related due to stress at home as she cares for her husband and daughter. Add omeprazole for possible GERD. Add SL NTG PRN.   2. HTN: BP is moderately elevated. Continue home lisinopril, HCTZ.  3. HLD: continue stain.    Signed, Lyda Jester, PA-C 01/09/2015, 4:03 PM  Attending Note:   The patient was seen and examined.  Agree with assessment and plan as noted above.  Changes made to the above note as needed.  [name] presents with several weeks of intermittent chest pain .  Seems to occur at various times.   Not necessarily related to exertion. Possibly similar to his previous angina in 2002 but not nearly as intense. Is under lots of stress with family issues.   ECG is unchaged. Troponin is negative   I think she can be discharged from the ER.   Will give her NTG to take as  needed. Will start omeprazole. Will arrange for her to get a Lexiscan myoview next week. She is to call 911 if she has persistent cp that does not resolve with SL NTG  [name], Brooke Bonito., MD, [facility] 01/09/2015, 5:07 PM [address],  [place] Pager [phone]

## 2015-01-09 NOTE — ED Provider Notes (Signed)
CSN: [id number]     Arrival date & time 01/09/15  1233 History   First MD Initiated Contact with Patient 01/09/15 1503     Chief Complaint  Patient presents with  . Chest Pain     (Consider location/radiation/quality/duration/timing/severity/associated sxs/prior Treatment) Patient is a 82 y.o. female presenting with chest pain. The history is provided by the patient.  Chest Pain Pain location:  Substernal area Pain quality comment:  Heaviness Pain radiates to:  Does not radiate (noted right arm pain for 15 minutes 2 weeks ago) Pain radiates to the back: no   Pain severity:  Mild Onset quality:  Gradual Duration:  2 weeks Timing:  Intermittent Progression:  Waxing and waning Chronicity:  New Context: at rest   Relieved by: belching, xanax. Worsened by:  Nothing tried Ineffective treatments:  None tried Associated symptoms: anxiety   Associated symptoms: no abdominal pain, no cough, no diaphoresis, no dysphagia, no fever, no nausea, no near-syncope and not vomiting   Risk factors: coronary artery disease (stent in 2002, similar pain), high cholesterol and hypertension   Risk factors: no diabetes mellitus and no smoking     Past Medical History  Diagnosis Date  . Coronary artery disease   . Hypertension   . Hyperlipemia   . S/P carotid endarterectomy     RIGHT CAROTID   Past Surgical History  Procedure Laterality Date  . Carotid endarterectomy    . Coronary angioplasty with stent placement      FIRST DIAGONAL   Family History  Problem Relation Age of Onset  . Stroke Father     DECEASED  . Stroke Mother     DECEASED  . Hypertension Mother     DECEASED  . Stroke Brother    Social History  Substance Use Topics  . Smoking status: Never Smoker   . Smokeless tobacco: None  . Alcohol Use: No   OB History    No data available     Review of Systems  Constitutional: Negative for fever and diaphoresis.  HENT: Negative for trouble swallowing.   Respiratory:  Negative for cough.   Cardiovascular: Positive for chest pain. Negative for near-syncope.  Gastrointestinal: Negative for nausea, vomiting and abdominal pain.      Allergies  Bystolic and Codeine  Home Medications   Prior to Admission medications   Medication Sig Start Date End Date Taking? Authorizing Provider  ALPRAZolam [name]) 0.5 MG tablet Take 0.5 mg by mouth at bedtime as needed for sleep (may take two tablets if needed).     Historical Provider, MD  aspirin EC 81 MG tablet Take 81 mg by mouth once. 11/15/11   [name], MD  atorvastatin (LIPITOR) 40 MG tablet Take 1 tablet (40 mg total) by mouth daily. 02/12/14   [name], MD  Calcium Carbonate-Vitamin D (CALTRATE 600+D PO) Take by mouth 2 (two) times daily.     Historical Provider, MD  hydrochlorothiazide (HYDRODIURIL) 25 MG tablet Take 1 tablet (25 mg total) by mouth daily. 11/13/14   [name], MD  lisinopril (PRINIVIL,ZESTRIL) 40 MG tablet Take 1 tablet (40 mg total) by mouth daily. 11/13/14   [name], MD  potassium chloride (MICRO-K) 10 MEQ CR capsule Take 1 capsule (10 mEq total) by mouth daily. 11/18/14   [name], MD  valACYclovir (VALTREX) 1000 MG tablet Take 1,000 mg by mouth as needed (1 tablet daily as needed for breakouts).     Historical Provider, MD  BP 168/62 mmHg  Pulse 77  Temp(Src) 97.8 F (36.6 C) (Oral)  Resp 18  SpO2 100% Physical Exam  Constitutional: She is oriented to person, place, and time. She appears well-developed and well-nourished. No distress.  HENT:  Head: Normocephalic.  Eyes: Conjunctivae are normal.  Neck: Neck supple. No tracheal deviation present.  Cardiovascular: Normal rate, regular rhythm and normal heart sounds.   Pulmonary/Chest: Effort normal and breath sounds normal. No respiratory distress. She has no wheezes. She has no rales.  Abdominal: Soft. She exhibits no distension. There is no tenderness.  Neurological: She is alert and oriented to  person, place, and time.  Skin: Skin is warm and dry.  Psychiatric: She has a normal mood and affect.    ED Course  Procedures (including critical care time) Labs Review Labs Reviewed  BASIC METABOLIC PANEL - Abnormal; Notable for the following:    Glucose, Bld 125 (*)    Creatinine, Ser 1.04 (*)    GFR calc non Af Amer 49 (*)    GFR calc Af Amer 56 (*)    All other components within normal limits  CBC  I-STAT TROPOININ, ED    Imaging Review Dg Chest 2 View  01/09/2015  CLINICAL DATA:  Chest pain. EXAM: CHEST  2 VIEW COMPARISON:  11/16/2006 FINDINGS: Heart size and pulmonary vascularity are normal and the lungs are clear. No effusions. No acute osseous abnormality. Diffuse degenerative changes in the spine with a slight thoracolumbar scoliosis. Chronic rotator cuff disease at both shoulders. IMPRESSION: No acute disease in the chest. Electronically Signed   By: Lorriane Shire M.D.   On: 01/09/2015 14:06   I have personally reviewed and evaluated these images and lab results as part of my medical decision-making.   EKG Interpretation   Date/Time:  Friday January 09 2015 12:39:34 EST Ventricular Rate:  81 PR Interval:  160 QRS Duration: 140 QT Interval:  406 QTC Calculation: 471 R Axis:   83 Text Interpretation:  Sinus rhythm with Possible Premature atrial  complexes with Abberant conduction Right bundle branch block Abnormal ECG  Right bundle branch block NOW PRESENT Confirmed by Kharlie Bring MD, Quillian Quince  [id number]) on 01/09/2015 3:04:53 PM      MDM   Final diagnoses:  Chest pain, unspecified chest pain type    82 year old female with history of coronary artery disease presents with intermittent chest tightness over the last 2 weeks. No radiation or other typical symptoms concerning for ACS currently. Troponin is negative. Very high risk demographic given previous history of coronary disease. EKG with right bundle which was present on previous tracings from outpatient monitoring. No  significant hematologic or metabolic abnormalities to explain symptoms. Cardiology was consulted regarding this patient and her cardiologist Dr. Acie Fredrickson saw her in the emergency department recommending home nitroglycerin and plans to perform provocative testing in the near future. Patient is in agreement with this plan and is stable for discharge. No active chest pain on discharge. Strict return precautions were discussed for worsening or new concerning symptoms.    Leo Grosser, MD 01/09/15 [phone]

## 2015-01-09 NOTE — ED Notes (Signed)
Patient undressed, in gown, on monitor, continuous pulse oximetry and blood pressure cuff 

## 2015-01-09 NOTE — ED Notes (Signed)
Pt reports mid chest tightness x 2 weeks. Denies sob. No acute distress noted at triage, ekg done.

## 2015-01-09 NOTE — Discharge Instructions (Signed)
Nonspecific Chest Pain  °Chest pain can be caused by many different conditions. There is always a chance that your pain could be related to something serious, such as a heart attack or a blood clot in your lungs. Chest pain can also be caused by conditions that are not life-threatening. If you have chest pain, it is very important to follow up with your health care provider. °CAUSES  °Chest pain can be caused by: °· Heartburn. °· Pneumonia or bronchitis. °· Anxiety or stress. °· Inflammation around your heart (pericarditis) or lung (pleuritis or pleurisy). °· A blood clot in your lung. °· A collapsed lung (pneumothorax). It can develop suddenly on its own (spontaneous pneumothorax) or from trauma to the chest. °· Shingles infection (varicella-zoster virus). °· Heart attack. °· Damage to the bones, muscles, and cartilage that make up your chest wall. This can include: °¨ Bruised bones due to injury. °¨ Strained muscles or cartilage due to frequent or repeated coughing or overwork. °¨ Fracture to one or more ribs. °¨ Sore cartilage due to inflammation (costochondritis). °RISK FACTORS  °Risk factors for chest pain may include: °· Activities that increase your risk for trauma or injury to your chest. °· Respiratory infections or conditions that cause frequent coughing. °· Medical conditions or overeating that can cause heartburn. °· Heart disease or family history of heart disease. °· Conditions or health behaviors that increase your risk of developing a blood clot. °· Having had chicken pox (varicella zoster). °SIGNS AND SYMPTOMS °Chest pain can feel like: °· Burning or tingling on the surface of your chest or deep in your chest. °· Crushing, pressure, aching, or squeezing pain. °· Dull or sharp pain that is worse when you move, cough, or take a deep breath. °· Pain that is also felt in your back, neck, shoulder, or arm, or pain that spreads to any of these areas. °Your chest pain may come and go, or it may stay  constant. °DIAGNOSIS °Lab tests or other studies may be needed to find the cause of your pain. Your health care provider may have you take a test called an ambulatory ECG (electrocardiogram). An ECG records your heartbeat patterns at the time the test is performed. You may also have other tests, such as: °· Transthoracic echocardiogram (TTE). During echocardiography, sound waves are used to create a picture of all of the heart structures and to look at how blood flows through your heart. °· Transesophageal echocardiogram (TEE). This is a more advanced imaging test that obtains images from inside your body. It allows your health care provider to see your heart in finer detail. °· Cardiac monitoring. This allows your health care provider to monitor your heart rate and rhythm in real time. °· Holter monitor. This is a portable device that records your heartbeat and can help to diagnose abnormal heartbeats. It allows your health care provider to track your heart activity for several days, if needed. °· Stress tests. These can be done through exercise or by taking medicine that makes your heart beat more quickly. °· Blood tests. °· Imaging tests. °TREATMENT  °Your treatment depends on what is causing your chest pain. Treatment may include: °· Medicines. These may include: °¨ Acid blockers for heartburn. °¨ Anti-inflammatory medicine. °¨ Pain medicine for inflammatory conditions. °¨ Antibiotic medicine, if an infection is present. °¨ Medicines to dissolve blood clots. °¨ Medicines to treat coronary artery disease. °· Supportive care for conditions that do not require medicines. This may include: °¨ Resting. °¨ Applying heat   or cold packs to injured areas. °¨ Limiting activities until pain decreases. °HOME CARE INSTRUCTIONS °· If you were prescribed an antibiotic medicine, finish it all even if you start to feel better. °· Avoid any activities that bring on chest pain. °· Do not use any tobacco products, including  cigarettes, chewing tobacco, or electronic cigarettes. If you need help quitting, ask your health care provider. °· Do not drink alcohol. °· Take medicines only as directed by your health care provider. °· Keep all follow-up visits as directed by your health care provider. This is important. This includes any further testing if your chest pain does not go away. °· If heartburn is the cause for your chest pain, you may be told to keep your head raised (elevated) while sleeping. This reduces the chance that acid will go from your stomach into your esophagus. °· Make lifestyle changes as directed by your health care provider. These may include: °¨ Getting regular exercise. Ask your health care provider to suggest some activities that are safe for you. °¨ Eating a heart-healthy diet. A registered dietitian can help you to learn healthy eating options. °¨ Maintaining a healthy weight. °¨ Managing diabetes, if necessary. °¨ Reducing stress. °SEEK MEDICAL CARE IF: °· Your chest pain does not go away after treatment. °· You have a rash with blisters on your chest. °· You have a fever. °SEEK IMMEDIATE MEDICAL CARE IF:  °· Your chest pain is worse. °· You have an increasing cough, or you cough up blood. °· You have severe abdominal pain. °· You have severe weakness. °· You faint. °· You have chills. °· You have sudden, unexplained chest discomfort. °· You have sudden, unexplained discomfort in your arms, back, neck, or jaw. °· You have shortness of breath at any time. °· You suddenly start to sweat, or your skin gets clammy. °· You feel nauseous or you vomit. °· You suddenly feel light-headed or dizzy. °· Your heart begins to beat quickly, or it feels like it is skipping beats. °These symptoms may represent a serious problem that is an emergency. Do not wait to see if the symptoms will go away. Get medical help right away. Call your local emergency services (911 in the [place] Do not drive yourself to the hospital. °  °This  information is not intended to replace advice given to you by your health care provider. Make sure you discuss any questions you have with your health care provider. °  °Document Released: 09/29/2004 Document Revised: 01/10/2014 Document Reviewed: 07/26/2013 °[employer]vier Interactive Patient Education ©2016 [employer] ° °

## 2015-01-09 NOTE — Consult Note (Deleted)
Patient ID: ALICAI RENTIE MRN: [id number], DOB/AGE: April 14, 1930   Admit date: 01/09/2015   Primary Physician: [name], MD Primary Cardiologist: Dr. Acie Fredrickson  Pt. Profile:  82 year old female with known coronary disease as well as a history of hypertension hyperlipidemia and peripheral vascular disease presenting to the ED with complaint of chest pain.  Problem List  Past Medical History  Diagnosis Date  . Coronary artery disease   . Hypertension   . Hyperlipemia   . S/P carotid endarterectomy     RIGHT CAROTID    Past Surgical History  Procedure Laterality Date  . Carotid endarterectomy    . Coronary angioplasty with stent placement      FIRST DIAGONAL     Allergies  Allergies  Allergen Reactions  . Bystolic [Nebivolol Hcl] Other (See Comments)    Mouth sores  . Codeine Other (See Comments)    Strange feeling per patient    HPI  The patient is a 82 year old female, followed by Dr. Acie Fredrickson, who presents to the [facility] ED with a complaint of chest pain. Her past cardiac history is significant for CAD status post PTCA cutting balloon to the first diagonal, hypertension, hyperlipidemia, peripheral vascular disease status post right carotid endarterectomy as well as a history of cerebral aneurysm status post coil embolization in 2008. Intervention to the first diagonal was in 2002.  groin and he and At that time she was also noted to have mild LAD disease with 30-40% stenosis in the midportion. EF at that time was 65-70%.  She reports the development of chest pain several weeks ago. It has been intermittent occurring off and on. Substernal. Non-radiating. Feels like indigestion. It is somewhat similar to her angina she had prior to undergoing PCI in 2012. Occurs at rest but she hasn't noticed any exertional components. No relationship with meals. It has been relieved with belching and she has also experienced some relief with Gas-X. She has not tried sublingual  nitroglycerin at home. She denies any associated symptoms. No dyspnea, diaphoresis, nausea vomiting, syncope/near-syncope. She reports full medication compliance at home. She reports that she has been under a great deal of stress at home. Her husband has been sick and she is also the caretaker of her disabled daughter.   She reports that she went to see her eye doctor today and mentioned her recent symptoms. Her eye doctor advised that she check in with our office to report her symptoms. She called our office today and spoke with Dr. [name] recommended that she come to the ED for evaluation.  In the ED, EKG shows sinus rhythm with PACs. POC troponin is negative. CBC and BMP are both unremarkable. Serum creatinine is 1.04. Chest x-ray is unremarkable. She is currently CP free.   Home Medications  Prior to Admission medications   Medication Sig Start Date End Date Taking? Authorizing Provider  ALPRAZolam [name]) 0.5 MG tablet Take 0.5 mg by mouth at bedtime as needed for sleep (may take two tablets if needed).     Historical Provider, MD  aspirin EC 81 MG tablet Take 81 mg by mouth once. 11/15/11   [name], MD  atorvastatin (LIPITOR) 40 MG tablet Take 1 tablet (40 mg total) by mouth daily. 02/12/14   [name], MD  Calcium Carbonate-Vitamin D (CALTRATE 600+D PO) Take by mouth 2 (two) times daily.     Historical Provider, MD  hydrochlorothiazide (HYDRODIURIL) 25 MG tablet Take 1 tablet (25 mg total) by  mouth daily. 11/13/14   [name], MD  lisinopril (PRINIVIL,ZESTRIL) 40 MG tablet Take 1 tablet (40 mg total) by mouth daily. 11/13/14   [name], MD  potassium chloride (MICRO-K) 10 MEQ CR capsule Take 1 capsule (10 mEq total) by mouth daily. 11/18/14   [name], MD  valACYclovir (VALTREX) 1000 MG tablet Take 1,000 mg by mouth as needed (1 tablet daily as needed for breakouts).     Historical Provider, MD    Family History  Family History  Problem Relation  Age of Onset  . Stroke Father     DECEASED  . Stroke Mother     DECEASED  . Hypertension Mother     DECEASED  . Stroke Brother     Social History  Social History   Social History  . Marital Status: Married    Spouse Name: N/A  . Number of Children: N/A  . Years of Education: N/A   Occupational History  . Not on file.   Social History Main Topics  . Smoking status: Never Smoker   . Smokeless tobacco: Not on file  . Alcohol Use: No  . Drug Use: Not on file  . Sexual Activity: Not on file   Other Topics Concern  . Not on file   Social History Narrative     Review of Systems General:  No chills, fever, night sweats or weight changes.  Cardiovascular:  No chest pain, dyspnea on exertion, edema, orthopnea, palpitations, paroxysmal nocturnal dyspnea. Dermatological: No rash, lesions/masses Respiratory: No cough, dyspnea Urologic: No hematuria, dysuria Abdominal:   No nausea, vomiting, diarrhea, bright red blood per rectum, melena, or hematemesis Neurologic:  No visual changes, wkns, changes in mental status. All other systems reviewed and are otherwise negative except as noted above.  Physical Exam  Blood pressure 168/62, pulse 77, temperature 97.8 F (36.6 C), temperature source Oral, resp. rate 18, SpO2 100 %.  General: Pleasant, NAD Psych: Normal affect. Neuro: Alert and oriented X 3. Moves all extremities spontaneously. HEENT: Normal  Neck: Supple without bruits or JVD. Lungs:  Resp regular and unlabored, CTA. Heart: RRR no s3, s4, or murmurs. Abdomen: Soft, non-tender, non-distended, BS + x 4.  Extremities: No clubbing, cyanosis or edema. DP/PT/Radials 2+ and equal bilaterally.  Labs  Troponin [facility] of Care Test)  Recent Labs  01/09/15 1250  TROPIPOC 0.00   No results for input(s): CKTOTAL, CKMB, TROPONINI in the last 72 hours. Lab Results  Component Value Date   WBC 6.7 01/09/2015   HGB 14.1 01/09/2015   HCT 42.2 01/09/2015   MCV 90.2  01/09/2015   PLT 211 01/09/2015    Recent Labs Lab 01/09/15 1242  NA 139  K 3.9  CL 104  CO2 24  BUN 15  CREATININE 1.04*  CALCIUM 10.1  GLUCOSE 125*   Lab Results  Component Value Date   CHOL 131 05/26/2014   HDL 64.20 05/26/2014   LDLCALC 57 05/26/2014   TRIG 51.0 05/26/2014   No results found for: DDIMER   Radiology/Studies  Dg Chest 2 View  01/09/2015  CLINICAL DATA:  Chest pain. EXAM: CHEST  2 VIEW COMPARISON:  11/16/2006 FINDINGS: Heart size and pulmonary vascularity are normal and the lungs are clear. No effusions. No acute osseous abnormality. Diffuse degenerative changes in the spine with a slight thoracolumbar scoliosis. Chronic rotator cuff disease at both shoulders. IMPRESSION: No acute disease in the chest. Electronically Signed   By: Lorriane Shire M.D.  On: 01/09/2015 14:06    ECG  Sinus rhythm with PACs, chronic RBBB and    ASSESSMENT AND PLAN   1. Chest pain: EKG is unchanged. Tropoin is negative. She is currently CP free. Will will plan to release from ED and will arrange for an outpatient myoview in our office next week. Also ? Possible GI etiology or anxiety related due to stress at home as she cares for her husband and daughter. Add omeprazole for possible GERD. Add SL NTG PRN.   2. HTN: BP is moderately elevated. Continue home lisinopril, HCTZ.  3. HLD: continue stain.    Signed, Lyda Jester, PA-C 01/09/2015, 4:03 PM

## 2015-01-09 NOTE — Telephone Encounter (Signed)
I talked with Dr. Acie Fredrickson by telephone from the hospital.  He advised if patient feels that pain is anginal, like previous pain prior to stent, that she should go to the ED for evaluation today or come in to see our FLEX APP.  He advised me to prescribe NTG 0.4 mg SL and omeprazole 20 mg once daily and that if patient does not get evaluation today, to take ntg as needed and if pain persists to call 911 for transport to [facility] ED.   I spoke with patient and she states pain does feel like previous pain prior to stent.  I advised her that she should proceed to ED for evaluation.  I advised her I will make Dr. Acie Fredrickson aware that she is coming.  She verbalized understanding and agreement.

## 2015-01-12 ENCOUNTER — Encounter: Payer: Self-pay | Admitting: Cardiovascular Disease

## 2015-01-12 ENCOUNTER — Telehealth: Payer: Self-pay | Admitting: Cardiovascular Disease

## 2015-01-12 DIAGNOSIS — R079 Chest pain, unspecified: Secondary | ICD-10-CM

## 2015-01-12 NOTE — Telephone Encounter (Signed)
New message    Patient calling went to emergency room on Friday per Dr. Acie Fredrickson.  Has Dr. Acie Fredrickson decided on echo testing.

## 2015-01-12 NOTE — Telephone Encounter (Signed)
Spoke with patient to advise her that Dr. Acie Fredrickson wants her to have a lexiscan myocardial perfusion study, not an echo.  I advised her that someone from our scheduling department will call her to schedule the test.  I asked her how she is feeling and she states she has had a few brief episodes of pain but pain has resolved without NTG.  I advised her to call back with questions or concerns.  She thanked me for the call.

## 2015-01-13 ENCOUNTER — Telehealth (HOSPITAL_COMMUNITY): Payer: Self-pay | Admitting: *Deleted

## 2015-01-13 NOTE — Telephone Encounter (Signed)
Left message on voicemail in reference to upcoming appointment scheduled for 01/14/15. Phone number given for a call back so details instructions can be given. Gram Siedlecki, Ranae Palms

## 2015-01-14 ENCOUNTER — Ambulatory Visit (HOSPITAL_BASED_OUTPATIENT_CLINIC_OR_DEPARTMENT_OTHER): Payer: Commercial Managed Care - HMO

## 2015-01-14 VITALS — Ht 64.0 in | Wt 144.0 lb

## 2015-01-14 DIAGNOSIS — R0789 Other chest pain: Secondary | ICD-10-CM | POA: Diagnosis not present

## 2015-01-14 DIAGNOSIS — R002 Palpitations: Secondary | ICD-10-CM | POA: Diagnosis not present

## 2015-01-14 DIAGNOSIS — R079 Chest pain, unspecified: Secondary | ICD-10-CM | POA: Diagnosis not present

## 2015-01-14 DIAGNOSIS — G4440 Drug-induced headache, not elsewhere classified, not intractable: Secondary | ICD-10-CM

## 2015-01-14 DIAGNOSIS — I739 Peripheral vascular disease, unspecified: Secondary | ICD-10-CM | POA: Diagnosis not present

## 2015-01-14 DIAGNOSIS — R0602 Shortness of breath: Secondary | ICD-10-CM

## 2015-01-14 DIAGNOSIS — I10 Essential (primary) hypertension: Secondary | ICD-10-CM | POA: Diagnosis not present

## 2015-01-14 DIAGNOSIS — R1084 Generalized abdominal pain: Secondary | ICD-10-CM

## 2015-01-14 DIAGNOSIS — I779 Disorder of arteries and arterioles, unspecified: Secondary | ICD-10-CM | POA: Diagnosis not present

## 2015-01-14 DIAGNOSIS — I671 Cerebral aneurysm, nonruptured: Secondary | ICD-10-CM | POA: Diagnosis not present

## 2015-01-14 DIAGNOSIS — Z8249 Family history of ischemic heart disease and other diseases of the circulatory system: Secondary | ICD-10-CM | POA: Diagnosis not present

## 2015-01-14 LAB — MYOCARDIAL PERFUSION IMAGING
LV dias vol: 86 mL
LV sys vol: 33 mL
Peak HR: 92 {beats}/min
RATE: 0.29
Rest HR: 63 {beats}/min
SDS: 0
SRS: 3
SSS: 3
TID: 0.85

## 2015-01-14 MED ORDER — REGADENOSON 0.4 MG/5ML IV SOLN
0.4000 mg | Freq: Once | INTRAVENOUS | Status: AC
  Administered 2015-01-14: 0.4 mg via INTRAVENOUS

## 2015-01-14 MED ORDER — TECHNETIUM TC 99M SESTAMIBI GENERIC - CARDIOLITE
10.1000 | Freq: Once | INTRAVENOUS | Status: AC | PRN
  Administered 2015-01-14: 10.1 via INTRAVENOUS

## 2015-01-14 MED ORDER — TECHNETIUM TC 99M SESTAMIBI GENERIC - CARDIOLITE
32.4000 | Freq: Once | INTRAVENOUS | Status: AC | PRN
  Administered 2015-01-14: 32 via INTRAVENOUS

## 2015-01-14 MED ORDER — AMINOPHYLLINE 25 MG/ML IV SOLN
75.0000 mg | Freq: Once | INTRAVENOUS | Status: AC
  Administered 2015-01-14: 75 mg via INTRAVENOUS

## 2015-01-15 ENCOUNTER — Encounter (HOSPITAL_COMMUNITY): Payer: Self-pay

## 2015-01-15 ENCOUNTER — Ambulatory Visit (HOSPITAL_COMMUNITY)
Admission: RE | Admit: 2015-01-15 | Discharge: 2015-01-15 | Disposition: A | Discharge status: Home / Self Care | Payer: Commercial Managed Care - HMO | Source: Ambulatory Visit | Attending: Interventional Radiology | Admitting: Interventional Radiology

## 2015-01-15 DIAGNOSIS — I10 Essential (primary) hypertension: Secondary | ICD-10-CM | POA: Insufficient documentation

## 2015-01-15 DIAGNOSIS — I739 Peripheral vascular disease, unspecified: Secondary | ICD-10-CM | POA: Insufficient documentation

## 2015-01-15 DIAGNOSIS — I779 Disorder of arteries and arterioles, unspecified: Secondary | ICD-10-CM | POA: Diagnosis not present

## 2015-01-15 DIAGNOSIS — I671 Cerebral aneurysm, nonruptured: Secondary | ICD-10-CM | POA: Insufficient documentation

## 2015-01-15 DIAGNOSIS — R002 Palpitations: Secondary | ICD-10-CM | POA: Insufficient documentation

## 2015-01-15 DIAGNOSIS — R0789 Other chest pain: Secondary | ICD-10-CM | POA: Insufficient documentation

## 2015-01-15 DIAGNOSIS — Z8249 Family history of ischemic heart disease and other diseases of the circulatory system: Secondary | ICD-10-CM | POA: Insufficient documentation

## 2015-01-15 DIAGNOSIS — R079 Chest pain, unspecified: Secondary | ICD-10-CM | POA: Diagnosis not present

## 2015-01-15 DIAGNOSIS — I729 Aneurysm of unspecified site: Secondary | ICD-10-CM

## 2015-01-15 MED ORDER — IOHEXOL 350 MG/ML SOLN
50.0000 mL | Freq: Once | INTRAVENOUS | Status: AC | PRN
  Administered 2015-01-15: 50 mL via INTRAVENOUS

## 2015-01-16 ENCOUNTER — Telehealth: Payer: Self-pay | Admitting: *Deleted

## 2015-01-16 NOTE — Telephone Encounter (Signed)
-----   Message from Consuelo Pandy, [place] sent at 01/16/2015  4:20 PM EST ----- Normal stress test. No signs of any blood flow issues. Pump function also normal.

## 2015-01-20 ENCOUNTER — Telehealth (HOSPITAL_COMMUNITY): Payer: Self-pay

## 2015-01-20 NOTE — Telephone Encounter (Signed)
Called pt to have her f/u in 2 years with another CTA per Dr. Estanislado Pandy. Pt agreed with this plan. AW

## 2015-01-21 DIAGNOSIS — F419 Anxiety disorder, unspecified: Secondary | ICD-10-CM | POA: Diagnosis not present

## 2015-02-19 DIAGNOSIS — F419 Anxiety disorder, unspecified: Secondary | ICD-10-CM | POA: Diagnosis not present

## 2015-03-02 ENCOUNTER — Other Ambulatory Visit: Payer: Self-pay | Admitting: Cardiovascular Disease

## 2015-03-24 DIAGNOSIS — L989 Disorder of the skin and subcutaneous tissue, unspecified: Secondary | ICD-10-CM | POA: Diagnosis not present

## 2015-03-25 DIAGNOSIS — D485 Neoplasm of uncertain behavior of skin: Secondary | ICD-10-CM | POA: Diagnosis not present

## 2015-03-25 DIAGNOSIS — L820 Inflamed seborrheic keratosis: Secondary | ICD-10-CM | POA: Diagnosis not present

## 2015-04-13 DIAGNOSIS — E7800 Pure hypercholesterolemia, unspecified: Secondary | ICD-10-CM | POA: Diagnosis not present

## 2015-04-13 DIAGNOSIS — M810 Age-related osteoporosis without current pathological fracture: Secondary | ICD-10-CM | POA: Diagnosis not present

## 2015-04-13 DIAGNOSIS — I10 Essential (primary) hypertension: Secondary | ICD-10-CM | POA: Diagnosis not present

## 2015-04-13 DIAGNOSIS — I700 Atherosclerosis of aorta: Secondary | ICD-10-CM | POA: Diagnosis not present

## 2015-04-13 DIAGNOSIS — F419 Anxiety disorder, unspecified: Secondary | ICD-10-CM | POA: Diagnosis not present

## 2015-04-13 DIAGNOSIS — Z0000 Encounter for general adult medical examination without abnormal findings: Secondary | ICD-10-CM | POA: Diagnosis not present

## 2015-04-14 ENCOUNTER — Encounter: Payer: Self-pay | Admitting: Cardiovascular Disease

## 2015-04-23 ENCOUNTER — Ambulatory Visit (INDEPENDENT_AMBULATORY_CARE_PROVIDER_SITE_OTHER): Payer: Commercial Managed Care - HMO | Admitting: Cardiovascular Disease

## 2015-04-23 ENCOUNTER — Encounter: Payer: Self-pay | Admitting: Cardiovascular Disease

## 2015-04-23 VITALS — BP 104/70 | HR 64 | Ht 64.0 in | Wt 140.1 lb

## 2015-04-23 DIAGNOSIS — E785 Hyperlipidemia, unspecified: Secondary | ICD-10-CM | POA: Diagnosis not present

## 2015-04-23 DIAGNOSIS — I2510 Atherosclerotic heart disease of native coronary artery without angina pectoris: Secondary | ICD-10-CM | POA: Diagnosis not present

## 2015-04-23 DIAGNOSIS — I2583 Coronary atherosclerosis due to lipid rich plaque: Principal | ICD-10-CM

## 2015-04-23 MED ORDER — LISINOPRIL 40 MG PO TABS: 40.0000 mg | ORAL_TABLET | Freq: Every day | ORAL | Status: DC

## 2015-04-23 MED ORDER — NITROGLYCERIN 0.4 MG SL SUBL: 0.4000 mg | SUBLINGUAL_TABLET | SUBLINGUAL | Status: DC | PRN

## 2015-04-23 MED ORDER — HYDROCHLOROTHIAZIDE 25 MG PO TABS: 25.0000 mg | ORAL_TABLET | Freq: Every day | ORAL | Status: DC

## 2015-04-23 MED ORDER — ATORVASTATIN CALCIUM 40 MG PO TABS: 40.0000 mg | ORAL_TABLET | Freq: Every day | ORAL | Status: DC

## 2015-04-23 MED ORDER — POTASSIUM CHLORIDE ER 10 MEQ PO CPCR: 10.0000 meq | ORAL_CAPSULE | Freq: Every day | ORAL | Status: DC

## 2015-04-23 NOTE — Patient Instructions (Signed)

## 2015-04-23 NOTE — Progress Notes (Signed)
[name] Date of Birth  [birth year] [facility] [address]    [place], [place]  [address] [phone]  Fax  [phone]  Problem list: 1. Coronary artery disease-status post PTCA cutting balloon to the first diagonal 2. Hypertension 3. Hyperlipidemia 4.  peripheral vascular disease-status post right carotid endarterectomy 5. cerebral aneurysm-status post coil embolization- 2008   History of Present Illness:  Lukisha is a 82 year old female with a history of coronary artery disease. She is  status post PTCA and cutting balloon procedure to her first diagonal artery. She also has a history of hypertension, hyperlipidemia and right carotid endarterectomy.  She denies any episodes of chest pain or shortness of breath.   She has had an occasional episode of lightheadedness.   She has not had any angina.  She complains of cold feet at night.  Jun 01, 2012:  Arah is doing OK - occasional indigestion. Denies any CP or dyspnea.     Several weeks ago she had a presyncopal episode while driving her car.  It ~ 5 seconds and resolved.   Dec. 2, 2014:  Isbella is doing well. She's not had any further episodes of syncope or presyncope.  She exercises on occasion.  June 04, 2013:  Valary is upset today.  Her daughter has severe mental retardation and is in a group home and the group home is closing.    She is doing OK from a cardiac standpoint.    She has not had a chance to do much exercise or other activities.    Dec. 15, 2015:  Takyla is a 82 yo with hx of CAD, HTN,  PVD, hyperlipidemia.  No CP or dyspnea.  Sept. 29, 2016:  No CP or dyspnea.  Doing well overall   April 23, 2015: Feels well  She was having some chest pain earlier this year. A stress Myoview study revealed no evidence of ischemia. Left ventricular systolic function is normal.    Current Outpatient Prescriptions on File Prior to Visit  Medication Sig Dispense Refill  . ALPRAZolam (XANAX) 0.5 MG tablet  Take 0.25 mg by mouth daily as needed for anxiety.     Marland Kitchen aspirin EC 81 MG tablet Take 81 mg by mouth daily.     Marland Kitchen atorvastatin (LIPITOR) 40 MG tablet TAKE 1 TABLET EVERY DAY (Patient taking differently: TAKE 1 TABLET by mouth EVERY DAY) 90 tablet 0  . Calcium-Magnesium-Vitamin D (CALCIUM 1200+D3 PO) Take 1 tablet by mouth 2 (two) times daily. Vitamin D3 1000 units    . clonazePAM (KLONOPIN) 0.5 MG tablet Take 0.5 mg by mouth at bedtime as needed (sleep).     . hydrochlorothiazide (HYDRODIURIL) 25 MG tablet Take 1 tablet (25 mg total) by mouth daily. 90 tablet 1  . lisinopril (PRINIVIL,ZESTRIL) 40 MG tablet Take 1 tablet (40 mg total) by mouth daily. 90 tablet 1  . naproxen sodium (ALEVE) 220 MG tablet Take 220 mg by mouth daily as needed (arthritis pain).    . nitroGLYCERIN (NITROSTAT) 0.4 MG SL tablet Place 1 tablet (0.4 mg total) under the tongue every 5 (five) minutes as needed for chest pain (for up to 3 doses). 30 tablet 0  . potassium chloride (MICRO-K) 10 MEQ CR capsule Take 1 capsule (10 mEq total) by mouth daily. 90 capsule 1  . valACYclovir (VALTREX) 1000 MG tablet Take 2,000 mg by mouth once as needed (breakouts).      No current facility-administered medications on file prior to visit.  Allergies  Allergen Reactions  . Bystolic [Nebivolol Hcl] Other (See Comments)    Mouth sores  . Codeine Other (See Comments)    Strange feeling per patient    Past Medical History  Diagnosis Date  . Coronary artery disease   . Hypertension   . Hyperlipemia   . S/P carotid endarterectomy     RIGHT CAROTID    Past Surgical History  Procedure Laterality Date  . Carotid endarterectomy    . Coronary angioplasty with stent placement      FIRST DIAGONAL    History  Smoking status  . Never Smoker   Smokeless tobacco  . Not on file    History  Alcohol Use No    Family History  Problem Relation Age of Onset  . Stroke Father     DECEASED  . Stroke Mother     DECEASED  .  Hypertension Mother     DECEASED  . Stroke Brother     Reviw of Systems:  Reviewed in the HPI.  All other systems are negative.  Physical Exam: BP 104/70 mmHg  Pulse 64  Ht 5\' 4"  (1.626 m)  Wt 140 lb 1.9 oz (63.558 kg)  BMI 24.04 kg/m2 The patient is alert and oriented x 3.  The mood and affect are normal.   Skin: warm and dry.  Color is normal.    HEENT:   the sclera are nonicteric.  The mucous membranes are moist.  The carotids are 2+ without bruits.  There is no thyromegaly.  There is no JVD.    Lungs: clear.  The chest wall is non tender.    Heart: regular rate with a normal S1 and S2.  There are no murmurs, gallops, or rubs. The PMI is not displaced.     Abdomen: good bowel sounds.  There is no guarding or rebound.  There is no hepatosplenomegaly or tenderness.  There are no masses.   Extremities:  no clubbing, cyanosis, or edema.  The legs are without rashes.  The distal pulses are intact.   Neuro:  Cranial nerves II - XII are intact.  Motor and sensory functions are intact.    The gait is normal.  ECG:   Assessment / Plan:   1. Coronary artery disease-status post PTCA cutting balloon to the first diagonal -  She's not having any angina. Continue current medications.  Myoview study in January was negative for ischemia. 2. Hypertension-   Her blood pressure is well-controlled. Continue current medications. 3. Hyperlipidemia-   Her most recent labs look great. We'll recheck fasting lipid, liver enzymes, basic medical profile procedure again in 6 months . 4.  peripheral vascular disease-status post right carotid endarterectomy - no carotid bruits  5. cerebral aneurysm-status post coil embolization- 2008    Nahser, Wonda Cheng, MD  04/23/2015 11:13 AM    [facility] [place],  [place], [place]  [address] Pager [phone] Phone: [phone]; Fax: [phone]

## 2015-05-12 DIAGNOSIS — M810 Age-related osteoporosis without current pathological fracture: Secondary | ICD-10-CM | POA: Diagnosis not present

## 2015-06-12 DIAGNOSIS — D225 Melanocytic nevi of trunk: Secondary | ICD-10-CM | POA: Diagnosis not present

## 2015-06-12 DIAGNOSIS — D485 Neoplasm of uncertain behavior of skin: Secondary | ICD-10-CM | POA: Diagnosis not present

## 2015-06-12 DIAGNOSIS — L570 Actinic keratosis: Secondary | ICD-10-CM | POA: Diagnosis not present

## 2015-06-12 DIAGNOSIS — L821 Other seborrheic keratosis: Secondary | ICD-10-CM | POA: Diagnosis not present

## 2015-06-12 DIAGNOSIS — L814 Other melanin hyperpigmentation: Secondary | ICD-10-CM | POA: Diagnosis not present

## 2015-06-12 DIAGNOSIS — D2372 Other benign neoplasm of skin of left lower limb, including hip: Secondary | ICD-10-CM | POA: Diagnosis not present

## 2015-06-12 DIAGNOSIS — D1801 Hemangioma of skin and subcutaneous tissue: Secondary | ICD-10-CM | POA: Diagnosis not present

## 2015-08-10 DIAGNOSIS — Z1231 Encounter for screening mammogram for malignant neoplasm of breast: Secondary | ICD-10-CM | POA: Diagnosis not present

## 2015-09-02 ENCOUNTER — Other Ambulatory Visit: Payer: Self-pay | Admitting: Cardiovascular Disease

## 2015-10-21 ENCOUNTER — Other Ambulatory Visit: Payer: Commercial Managed Care - HMO | Admitting: *Deleted

## 2015-10-21 ENCOUNTER — Ambulatory Visit (INDEPENDENT_AMBULATORY_CARE_PROVIDER_SITE_OTHER): Payer: Commercial Managed Care - HMO | Admitting: Cardiovascular Disease

## 2015-10-21 ENCOUNTER — Encounter: Payer: Self-pay | Admitting: Cardiovascular Disease

## 2015-10-21 VITALS — BP 100/60 | HR 72 | Ht 64.0 in | Wt 143.2 lb

## 2015-10-21 DIAGNOSIS — I2510 Atherosclerotic heart disease of native coronary artery without angina pectoris: Secondary | ICD-10-CM | POA: Diagnosis not present

## 2015-10-21 DIAGNOSIS — E7800 Pure hypercholesterolemia, unspecified: Secondary | ICD-10-CM

## 2015-10-21 DIAGNOSIS — I951 Orthostatic hypotension: Secondary | ICD-10-CM

## 2015-10-21 DIAGNOSIS — I10 Essential (primary) hypertension: Secondary | ICD-10-CM

## 2015-10-21 LAB — COMPREHENSIVE METABOLIC PANEL
ALT: 15 U/L (ref 6–29)
AST: 25 U/L (ref 10–35)
Albumin: 4 g/dL (ref 3.6–5.1)
Alkaline Phosphatase: 53 U/L (ref 33–130)
BUN: 14 mg/dL (ref 7–25)
CO2: 27 mmol/L (ref 20–31)
Calcium: 9.7 mg/dL (ref 8.6–10.4)
Chloride: 101 mmol/L (ref 98–110)
Creat: 0.77 mg/dL (ref 0.60–0.88)
Glucose, Bld: 97 mg/dL (ref 65–99)
Potassium: 4.1 mmol/L (ref 3.5–5.3)
Sodium: 138 mmol/L (ref 135–146)
Total Bilirubin: 0.7 mg/dL (ref 0.2–1.2)
Total Protein: 6.5 g/dL (ref 6.1–8.1)

## 2015-10-21 LAB — LIPID PANEL
Cholesterol: 138 mg/dL (ref 125–200)
HDL: 66 mg/dL (ref >=46)
LDL Cholesterol: 59 mg/dL (ref <130)
Total CHOL/HDL Ratio: 2.1 Ratio (ref <=5.0)
Triglycerides: 65 mg/dL (ref <150)
VLDL: 13 mg/dL (ref <30)

## 2015-10-21 MED ORDER — LISINOPRIL 20 MG PO TABS: 20.0000 mg | ORAL_TABLET | Freq: Every day | ORAL | 3 refills | Status: DC

## 2015-10-21 NOTE — Addendum Note (Signed)
Addended by: Eulis Foster on: 10/21/2015 07:59 AM   Modules accepted: Orders

## 2015-10-21 NOTE — Patient Instructions (Addendum)
Medication Instructions:  DECREASE Lisinopril to 20 mg once daily   Labwork: Your physician recommends that you return for lab work in: 6 months on the day of or a few days before your office visit with Dr. Acie Fredrickson.  You will need to FAST for this appointment - nothing to eat or drink after midnight the night before except water.    Testing/Procedures: None Ordered   Follow-Up: Your physician wants you to follow-up in: 6 months with Dr. Acie Fredrickson.  You will receive a reminder letter in the mail two months in advance. If you don't receive a letter, please call our office to schedule the follow-up appointment.   If you need a refill on your cardiac medications before your next appointment, please call your pharmacy.   Thank you for choosing [facility]! Christen Bame, RN [phone]

## 2015-10-21 NOTE — Progress Notes (Signed)
[name] Date of Birth  1930/11/18 [facility] [address]    [name]  [address] [phone]  Fax  [phone]  Problem list: 1. Coronary artery disease-status post PTCA cutting balloon to the first diagonal 2. Hypertension 3. Hyperlipidemia 4.  peripheral vascular disease-status post right carotid endarterectomy 5. cerebral aneurysm-status post coil embolization- 2008     [name] is a 82 year old female with a history of coronary artery disease. She is  status post PTCA and cutting balloon procedure to her first diagonal artery. She also has a history of hypertension, hyperlipidemia and right carotid endarterectomy.  She denies any episodes of chest pain or shortness of breath.   She has had an occasional episode of lightheadedness.   She has not had any angina.  She complains of cold feet at night.  Jun 01, 2012:  [name] is doing OK - occasional indigestion. Denies any CP or dyspnea.     Several weeks ago she had a presyncopal episode while driving her car.  It ~ 5 seconds and resolved.   Dec. 2, 2014:  [name] is doing well. She's not had any further episodes of syncope or presyncope.  She exercises on occasion.  June 04, 2013:  [name] is upset today.  Her daughter has severe mental retardation and is in a group home and the group home is closing.    She is doing OK from a cardiac standpoint.    She has not had a chance to do much exercise or other activities.    Dec. 15, 2015:  [name] is a 82 yo with hx of CAD, HTN,  PVD, hyperlipidemia.  No CP or dyspnea.  Sept. 29, 2016:   No CP or dyspnea.  Doing well overall   April 23, 2015:  Feels well  She was having some chest pain earlier this year. A stress Myoview study revealed no evidence of ischemia. Left ventricular systolic function is normal.  Oct. 18, 2017: No CP , has some DOE Feels "woozy" in the am .  Improves after she gets going .  No angina .     Current Outpatient Prescriptions on  File Prior to Visit  Medication Sig Dispense Refill  . ALPRAZolam (XANAX) 0.5 MG tablet Take 0.25 mg by mouth daily as needed for anxiety.     Marland Kitchen aspirin EC 81 MG tablet Take 81 mg by mouth daily.     Marland Kitchen atorvastatin (LIPITOR) 40 MG tablet Take 1 tablet (40 mg total) by mouth daily. 90 tablet 2  . Calcium-Magnesium-Vitamin D (CALCIUM 1200+D3 PO) Take 1 tablet by mouth 2 (two) times daily. Vitamin D3 1000 units    . clonazePAM (KLONOPIN) 0.5 MG tablet Take 0.5 mg by mouth at bedtime as needed (sleep).     . DULoxetine (CYMBALTA) 30 MG capsule Take 30 mg by mouth daily.    . hydrochlorothiazide (HYDRODIURIL) 25 MG tablet Take 1 tablet (25 mg total) by mouth daily. 90 tablet 3  . lisinopril (PRINIVIL,ZESTRIL) 40 MG tablet Take 1 tablet (40 mg total) by mouth daily. 90 tablet 3  . naproxen sodium (ALEVE) 220 MG tablet Take 220 mg by mouth daily as needed (arthritis pain).    . nitroGLYCERIN (NITROSTAT) 0.4 MG SL tablet Place 1 tablet (0.4 mg total) under the tongue every 5 (five) minutes as needed for chest pain (for up to 3 doses). 25 tablet 6  . potassium chloride (MICRO-K) 10 MEQ CR capsule Take 1 capsule (10 mEq total)  by mouth daily. 90 capsule 3  . valACYclovir (VALTREX) 1000 MG tablet Take 2,000 mg by mouth once as needed (breakouts).      No current facility-administered medications on file prior to visit.     Allergies  Allergen Reactions  . Bystolic [Nebivolol Hcl] Other (See Comments)    Mouth sores  . Codeine Other (See Comments)    Strange feeling per patient    Past Medical History:  Diagnosis Date  . Coronary artery disease   . Hyperlipemia   . Hypertension   . S/P carotid endarterectomy    RIGHT CAROTID    Past Surgical History:  Procedure Laterality Date  . CAROTID ENDARTERECTOMY    . CORONARY ANGIOPLASTY WITH STENT PLACEMENT     FIRST DIAGONAL    History  Smoking Status  . Never Smoker  Smokeless Tobacco  . Not on file    History  Alcohol Use No     Family History  Problem Relation Age of Onset  . Stroke Father     DECEASED  . Stroke Mother     DECEASED  . Hypertension Mother     DECEASED  . Stroke Brother     Reviw of Systems:  Reviewed in the HPI.  All other systems are negative.  Physical Exam: BP 100/60 (BP Location: Left Arm, Patient Position: Sitting, Cuff Size: Normal)   Pulse 72   Ht 5\' 4"  (1.626 m)   Wt 143 lb 3.2 oz (65 kg)   BMI 24.58 kg/m  The patient is alert and oriented x 3.  The mood and affect are normal.   Skin: warm and dry.  Color is normal.    HEENT:   the sclera are nonicteric.  The mucous membranes are moist.  The carotids are 2+ without bruits.  There is no thyromegaly.  There is no JVD.    Lungs: clear.  The chest wall is non tender.    Heart: regular rate with a normal S1 and S2.  There are no murmurs, gallops, or rubs. The PMI is not displaced.     Abdomen: good bowel sounds.  There is no guarding or rebound.  There is no hepatosplenomegaly or tenderness.  There are no masses.   Extremities:  no clubbing, cyanosis, or edema.  The legs are without rashes.  The distal pulses are intact.   Neuro:  Cranial nerves II - XII are intact.  Motor and sensory functions are intact.    The gait is normal.  ECG: Oct. 18, 2017:   NSR with PACs.  RBBB . HR  72   Assessment / Plan:   1. Coronary artery disease-status post PTCA cutting balloon to the first diagonal -  She's not having any angina. Continue current medications.  Myoview study in January was negative for ischemia. 2. Hypertension-   Her blood pressure is well-controlled. Continue current medications. 3. Hyperlipidemia-   Her most recent labs look great. We'll recheck fasting lipid, liver enzymes, basic medical profile procedure again in 6 months . 4.  peripheral vascular disease-status post right carotid endarterectomy - no carotid bruits  5. cerebral aneurysm-status post coil embolization- 2008    Heather Moores, MD  10/21/2015  8:42 AM    [facility] [place],  [place], [place]  [address] Pager [phone] Phone: [phone]; Fax: [phone]

## 2015-12-30 DIAGNOSIS — M810 Age-related osteoporosis without current pathological fracture: Secondary | ICD-10-CM | POA: Diagnosis not present

## 2015-12-30 DIAGNOSIS — I700 Atherosclerosis of aorta: Secondary | ICD-10-CM | POA: Diagnosis not present

## 2015-12-30 DIAGNOSIS — I10 Essential (primary) hypertension: Secondary | ICD-10-CM | POA: Diagnosis not present

## 2015-12-30 DIAGNOSIS — F419 Anxiety disorder, unspecified: Secondary | ICD-10-CM | POA: Diagnosis not present

## 2015-12-30 DIAGNOSIS — E7800 Pure hypercholesterolemia, unspecified: Secondary | ICD-10-CM | POA: Diagnosis not present

## 2016-01-11 DIAGNOSIS — H1851 Endothelial corneal dystrophy: Secondary | ICD-10-CM | POA: Diagnosis not present

## 2016-01-11 DIAGNOSIS — H52203 Unspecified astigmatism, bilateral: Secondary | ICD-10-CM | POA: Diagnosis not present

## 2016-01-11 DIAGNOSIS — H26493 Other secondary cataract, bilateral: Secondary | ICD-10-CM | POA: Diagnosis not present

## 2016-04-22 ENCOUNTER — Other Ambulatory Visit: Payer: Self-pay | Admitting: Cardiovascular Disease

## 2016-04-25 DIAGNOSIS — I700 Atherosclerosis of aorta: Secondary | ICD-10-CM | POA: Diagnosis not present

## 2016-04-25 DIAGNOSIS — Z0000 Encounter for general adult medical examination without abnormal findings: Secondary | ICD-10-CM | POA: Diagnosis not present

## 2016-04-25 DIAGNOSIS — M1991 Primary osteoarthritis, unspecified site: Secondary | ICD-10-CM | POA: Diagnosis not present

## 2016-04-25 DIAGNOSIS — E7800 Pure hypercholesterolemia, unspecified: Secondary | ICD-10-CM | POA: Diagnosis not present

## 2016-04-25 DIAGNOSIS — I671 Cerebral aneurysm, nonruptured: Secondary | ICD-10-CM | POA: Diagnosis not present

## 2016-04-25 DIAGNOSIS — M810 Age-related osteoporosis without current pathological fracture: Secondary | ICD-10-CM | POA: Diagnosis not present

## 2016-04-25 DIAGNOSIS — I10 Essential (primary) hypertension: Secondary | ICD-10-CM | POA: Diagnosis not present

## 2016-04-25 DIAGNOSIS — B009 Herpesviral infection, unspecified: Secondary | ICD-10-CM | POA: Diagnosis not present

## 2016-04-25 DIAGNOSIS — F419 Anxiety disorder, unspecified: Secondary | ICD-10-CM | POA: Diagnosis not present

## 2016-05-03 ENCOUNTER — Other Ambulatory Visit: Payer: Medicare HMO | Admitting: *Deleted

## 2016-05-03 DIAGNOSIS — I2510 Atherosclerotic heart disease of native coronary artery without angina pectoris: Secondary | ICD-10-CM

## 2016-05-03 LAB — SPECIMEN STATUS

## 2016-05-03 NOTE — Addendum Note (Signed)
Addended by: Eulis Foster on: 05/03/2016 08:46 AM   Modules accepted: Orders

## 2016-05-04 ENCOUNTER — Encounter: Payer: Self-pay | Admitting: Cardiovascular Disease

## 2016-05-04 ENCOUNTER — Ambulatory Visit (INDEPENDENT_AMBULATORY_CARE_PROVIDER_SITE_OTHER): Payer: Medicare HMO | Admitting: Cardiovascular Disease

## 2016-05-04 VITALS — BP 108/62 | HR 80 | Ht 64.0 in | Wt 143.1 lb

## 2016-05-04 DIAGNOSIS — I2510 Atherosclerotic heart disease of native coronary artery without angina pectoris: Secondary | ICD-10-CM

## 2016-05-04 DIAGNOSIS — I779 Disorder of arteries and arterioles, unspecified: Secondary | ICD-10-CM | POA: Diagnosis not present

## 2016-05-04 DIAGNOSIS — I739 Peripheral vascular disease, unspecified: Secondary | ICD-10-CM

## 2016-05-04 LAB — COMPREHENSIVE METABOLIC PANEL
ALT: 13 IU/L (ref 0–32)
AST: 23 IU/L (ref 0–40)
Albumin/Globulin Ratio: 1.6 (ref 1.2–2.2)
Albumin: 4 g/dL (ref 3.5–4.7)
Alkaline Phosphatase: 59 IU/L (ref 39–117)
BUN/Creatinine Ratio: 23 (ref 12–28)
BUN: 16 mg/dL (ref 8–27)
Bilirubin Total: 0.6 mg/dL (ref 0.0–1.2)
CO2: 25 mmol/L (ref 18–29)
Calcium: 9.7 mg/dL (ref 8.7–10.3)
Chloride: 97 mmol/L (ref 96–106)
Creatinine, Ser: 0.69 mg/dL (ref 0.57–1.00)
GFR calc Af Amer: 92 mL/min/{1.73_m2} (ref >59)
GFR calc non Af Amer: 80 mL/min/{1.73_m2} (ref >59)
Globulin, Total: 2.5 g/dL (ref 1.5–4.5)
Glucose: 97 mg/dL (ref 65–99)
Potassium: 4.2 mmol/L (ref 3.5–5.2)
Sodium: 138 mmol/L (ref 134–144)
Total Protein: 6.5 g/dL (ref 6.0–8.5)

## 2016-05-04 LAB — LIPID PANEL
Chol/HDL Ratio: 2.2 ratio (ref 0.0–4.4)
Cholesterol, Total: 133 mg/dL (ref 100–199)
HDL: 61 mg/dL (ref >39)
LDL Calculated: 58 mg/dL (ref 0–99)
Triglycerides: 69 mg/dL (ref 0–149)
VLDL Cholesterol Cal: 14 mg/dL (ref 5–40)

## 2016-05-04 NOTE — Progress Notes (Signed)
[name] Date of Birth  03/16/1930 [facility] [address]    [place], [place]  [address] [phone]  Fax  [phone]  Problem list: 1. Coronary artery disease-status post PTCA cutting balloon to the first diagonal 2. Hypertension 3. Hyperlipidemia 4.  peripheral vascular disease-status post right carotid endarterectomy 5. cerebral aneurysm-status post coil embolization- 2008     [name] is a 83 year old female with a history of coronary artery disease. She is  status post PTCA and cutting balloon procedure to her first diagonal artery. She also has a history of hypertension, hyperlipidemia and right carotid endarterectomy.  She denies any episodes of chest pain or shortness of breath.   She has had an occasional episode of lightheadedness.   She has not had any angina.  She complains of cold feet at night.  Jun 01, 2012:  [name] is doing OK - occasional indigestion. Denies any CP or dyspnea.     Several weeks ago she had a presyncopal episode while driving her car.  It ~ 5 seconds and resolved.   Dec. 2, 2014:  [name] is doing well. She's not had any further episodes of syncope or presyncope.  She exercises on occasion.  June 04, 2013:  [name] is upset today.  Her daughter has severe mental retardation and is in a group home and the group home is closing.    She is doing OK from a cardiac standpoint.    She has not had a chance to do much exercise or other activities.    Dec. 15, 2015:  [name] is a 83 yo with hx of CAD, HTN,  PVD, hyperlipidemia.  No CP or dyspnea.  Sept. 29, 2016:   No CP or dyspnea.  Doing well overall   April 23, 2015:  Feels well  She was having some chest pain earlier this year. A stress Myoview study revealed no evidence of ischemia. Left ventricular systolic function is normal.  Oct. 18, 2017: No CP , has some DOE Feels "woozy" in the am .  Improves after she gets going .  No angina .   May 04, 2016:  [name] is seen today for  follow up of her CAD, HTN, hyperlipidemia and carotid artery disease.  Doing well from a cardiac standpoint . Arthritis pain in feet. Able to do some exercise     Current Outpatient Prescriptions on File Prior to Visit  Medication Sig Dispense Refill  . ALPRAZolam (XANAX) 0.5 MG tablet Take 0.25 mg by mouth daily as needed for anxiety.     Marland Kitchen aspirin EC 81 MG tablet Take 81 mg by mouth daily.     Marland Kitchen atorvastatin (LIPITOR) 40 MG tablet Take 1 tablet (40 mg total) by mouth daily. 90 tablet 2  . Calcium-Magnesium-Vitamin D (CALCIUM 1200+D3 PO) Take 1 tablet by mouth 2 (two) times daily. Vitamin D3 1000 units    . clonazePAM (KLONOPIN) 0.5 MG tablet Take 0.5 mg by mouth at bedtime as needed (sleep).     . DULoxetine (CYMBALTA) 30 MG capsule Take 30 mg by mouth daily.    Marland Kitchen FLUZONE HIGH-DOSE 0.5 ML SUSY Inject as directed once.    . hydrochlorothiazide (HYDRODIURIL) 25 MG tablet Take 1 tablet (25 mg total) by mouth daily. 90 tablet 3  . lisinopril (PRINIVIL,ZESTRIL) 20 MG tablet Take 1 tablet (20 mg total) by mouth daily. 90 tablet 3  . naproxen sodium (ALEVE) 220 MG tablet Take 220 mg by mouth daily as needed (arthritis  pain).    . potassium chloride (MICRO-K) 10 MEQ CR capsule TAKE 1 CAPSULE (10 MEQ TOTAL) BY MOUTH DAILY. 90 capsule 1  . valACYclovir (VALTREX) 1000 MG tablet Take 2,000 mg by mouth once as needed (breakouts).     . nitroGLYCERIN (NITROSTAT) 0.4 MG SL tablet Place 1 tablet (0.4 mg total) under the tongue every 5 (five) minutes as needed for chest pain (for up to 3 doses). (Patient not taking: Reported on 05/04/2016) 25 tablet 6   No current facility-administered medications on file prior to visit.     Allergies  Allergen Reactions  . Bystolic [Nebivolol Hcl] Other (See Comments)    Mouth sores  . Codeine Other (See Comments)    Strange feeling per patient    Past Medical History:  Diagnosis Date  . Coronary artery disease   . Hyperlipemia   . Hypertension   . S/P carotid  endarterectomy    RIGHT CAROTID    Past Surgical History:  Procedure Laterality Date  . CAROTID ENDARTERECTOMY    . CORONARY ANGIOPLASTY WITH STENT PLACEMENT     FIRST DIAGONAL    History  Smoking Status  . Never Smoker  Smokeless Tobacco  . Never Used    History  Alcohol Use No    Family History  Problem Relation Age of Onset  . Stroke Father     DECEASED  . Stroke Mother     DECEASED  . Hypertension Mother     DECEASED  . Stroke Brother     Reviw of Systems:  Reviewed in the HPI.  All other systems are negative.  Physical Exam: BP 108/62 (BP Location: Left Arm, Patient Position: Sitting, Cuff Size: Normal)   Pulse 80   Ht 5\' 4"  (1.626 m)   Wt 143 lb 1.9 oz (64.9 kg)   SpO2 93%   BMI 24.57 kg/m  The patient is alert and oriented x 3.  The mood and affect are normal.   Skin: warm and dry.  Color is normal.    HEENT:   the sclera are nonicteric.  The mucous membranes are moist.  The carotids are 2+ without bruits.  There is no thyromegaly.  There is no JVD.    Lungs: clear.  The chest wall is non tender.    Heart: regular rate with a normal S1 and S2.  There are no murmurs, gallops, or rubs. The PMI is not displaced.     Abdomen: good bowel sounds.  There is no guarding or rebound.  There is no hepatosplenomegaly or tenderness.  There are no masses.   Extremities:  no clubbing, cyanosis, or edema.  The legs are without rashes.  The distal pulses are intact.   Neuro:  Cranial nerves II - XII are intact.  Motor and sensory functions are intact.    The gait is normal.  ECG: Oct. 18, 2017:   NSR with PACs.  RBBB . HR  72   Assessment / Plan:   1. Coronary artery disease-status post PTCA cutting balloon to the first diagonal -  She's not having any angina. Continue current medications.  Myoview study in January was negative for ischemia. 2. Hypertension-   Her blood pressure is well-controlled. Continue current medications. 3. Hyperlipidemia-   Her most  recent labs look great. We'll recheck fasting lipid, liver enzymes, basic medical profile procedure again in 6 months . 4.  peripheral vascular disease-status post right carotid endarterectomy - no carotid bruits  Her last carotid duplex  scan was from 2007. We will we will repeat the duplex scan for continued observation. 5. cerebral aneurysm-status post coil embolization- 2008    [name], MD  05/04/2016 9:42 AM    [facility] [place],  [place], [place]  [address] Pager [phone] Phone: [phone]; Fax: [phone]

## 2016-05-04 NOTE — Patient Instructions (Signed)
Medication Instructions:  Your physician recommends that you continue on your current medications as directed. Please refer to the Current Medication list given to you today.   Labwork: Your physician recommends that you return for lab work in: 6 months on the day of or a few days before your office visit with Dr. Nahser.  You will need to FAST for this appointment - nothing to eat or drink after midnight the night before except water.    Testing/Procedures: Your physician has requested that you have a carotid duplex. This test is an ultrasound of the carotid arteries in your neck. It looks at blood flow through these arteries that supply the brain with blood. Allow one hour for this exam. There are no restrictions or special instructions.   Follow-Up: Your physician wants you to follow-up in: 6 months with Dr. Nahser.  You will receive a reminder letter in the mail two months in advance. If you don't receive a letter, please call our office to schedule the follow-up appointment.   If you need a refill on your cardiac medications before your next appointment, please call your pharmacy.   Thank you for choosing [facility]! Raygen Dahm, RN [phone]    

## 2016-05-18 ENCOUNTER — Ambulatory Visit (HOSPITAL_COMMUNITY)
Admission: RE | Admit: 2016-05-18 | Discharge: 2016-05-18 | Disposition: A | Discharge status: Home / Self Care | Payer: Medicare HMO | Source: Ambulatory Visit | Attending: Cardiovascular Disease | Admitting: Cardiovascular Disease

## 2016-05-18 DIAGNOSIS — I10 Essential (primary) hypertension: Secondary | ICD-10-CM | POA: Insufficient documentation

## 2016-05-18 DIAGNOSIS — I6523 Occlusion and stenosis of bilateral carotid arteries: Secondary | ICD-10-CM | POA: Diagnosis not present

## 2016-05-18 DIAGNOSIS — I739 Peripheral vascular disease, unspecified: Secondary | ICD-10-CM

## 2016-05-18 DIAGNOSIS — E785 Hyperlipidemia, unspecified: Secondary | ICD-10-CM | POA: Diagnosis not present

## 2016-05-18 DIAGNOSIS — I2510 Atherosclerotic heart disease of native coronary artery without angina pectoris: Secondary | ICD-10-CM | POA: Diagnosis not present

## 2016-05-18 DIAGNOSIS — I779 Disorder of arteries and arterioles, unspecified: Secondary | ICD-10-CM | POA: Diagnosis present

## 2016-05-26 DIAGNOSIS — L720 Epidermal cyst: Secondary | ICD-10-CM | POA: Diagnosis not present

## 2016-05-26 DIAGNOSIS — R208 Other disturbances of skin sensation: Secondary | ICD-10-CM | POA: Diagnosis not present

## 2016-06-18 ENCOUNTER — Other Ambulatory Visit: Payer: Self-pay | Admitting: Cardiovascular Disease

## 2016-08-04 ENCOUNTER — Other Ambulatory Visit: Payer: Self-pay | Admitting: Cardiovascular Disease

## 2016-08-11 ENCOUNTER — Other Ambulatory Visit: Payer: Self-pay | Admitting: Cardiovascular Disease

## 2016-08-11 DIAGNOSIS — Z1231 Encounter for screening mammogram for malignant neoplasm of breast: Secondary | ICD-10-CM | POA: Diagnosis not present

## 2016-10-03 DIAGNOSIS — R079 Chest pain, unspecified: Secondary | ICD-10-CM | POA: Diagnosis not present

## 2016-10-24 ENCOUNTER — Other Ambulatory Visit: Payer: Self-pay | Admitting: *Deleted

## 2016-10-24 DIAGNOSIS — I6523 Occlusion and stenosis of bilateral carotid arteries: Secondary | ICD-10-CM

## 2016-10-26 DIAGNOSIS — I700 Atherosclerosis of aorta: Secondary | ICD-10-CM | POA: Diagnosis not present

## 2016-10-26 DIAGNOSIS — M810 Age-related osteoporosis without current pathological fracture: Secondary | ICD-10-CM | POA: Diagnosis not present

## 2016-10-26 DIAGNOSIS — E7800 Pure hypercholesterolemia, unspecified: Secondary | ICD-10-CM | POA: Diagnosis not present

## 2016-10-26 DIAGNOSIS — I10 Essential (primary) hypertension: Secondary | ICD-10-CM | POA: Diagnosis not present

## 2016-10-26 DIAGNOSIS — F419 Anxiety disorder, unspecified: Secondary | ICD-10-CM | POA: Diagnosis not present

## 2016-11-03 ENCOUNTER — Other Ambulatory Visit: Payer: Self-pay | Admitting: Cardiovascular Disease

## 2016-11-07 ENCOUNTER — Other Ambulatory Visit: Payer: Medicare HMO | Admitting: *Deleted

## 2016-11-07 DIAGNOSIS — I739 Peripheral vascular disease, unspecified: Secondary | ICD-10-CM

## 2016-11-07 DIAGNOSIS — I2510 Atherosclerotic heart disease of native coronary artery without angina pectoris: Secondary | ICD-10-CM | POA: Diagnosis not present

## 2016-11-07 DIAGNOSIS — I779 Disorder of arteries and arterioles, unspecified: Secondary | ICD-10-CM | POA: Diagnosis not present

## 2016-11-07 LAB — COMPREHENSIVE METABOLIC PANEL
ALT: 15 IU/L (ref 0–32)
AST: 24 IU/L (ref 0–40)
Albumin/Globulin Ratio: 1.7 (ref 1.2–2.2)
Albumin: 4 g/dL (ref 3.5–4.7)
Alkaline Phosphatase: 54 IU/L (ref 39–117)
BUN/Creatinine Ratio: 22 (ref 12–28)
BUN: 17 mg/dL (ref 8–27)
Bilirubin Total: 0.5 mg/dL (ref 0.0–1.2)
CO2: 27 mmol/L (ref 20–29)
Calcium: 9.8 mg/dL (ref 8.7–10.3)
Chloride: 99 mmol/L (ref 96–106)
Creatinine, Ser: 0.78 mg/dL (ref 0.57–1.00)
GFR calc Af Amer: 81 mL/min/{1.73_m2} (ref >59)
GFR calc non Af Amer: 70 mL/min/{1.73_m2} (ref >59)
Globulin, Total: 2.3 g/dL (ref 1.5–4.5)
Glucose: 102 mg/dL — ABNORMAL HIGH (ref 65–99)
Potassium: 4.4 mmol/L (ref 3.5–5.2)
Sodium: 136 mmol/L (ref 134–144)
Total Protein: 6.3 g/dL (ref 6.0–8.5)

## 2016-11-07 LAB — LIPID PANEL
Chol/HDL Ratio: 2.1 ratio (ref 0.0–4.4)
Cholesterol, Total: 131 mg/dL (ref 100–199)
HDL: 62 mg/dL (ref >39)
LDL Calculated: 58 mg/dL (ref 0–99)
Triglycerides: 54 mg/dL (ref 0–149)
VLDL Cholesterol Cal: 11 mg/dL (ref 5–40)

## 2016-11-09 ENCOUNTER — Telehealth: Payer: Self-pay | Admitting: Cardiovascular Disease

## 2016-11-09 NOTE — Telephone Encounter (Signed)
Patient returning call for lab results. 

## 2016-11-09 NOTE — Telephone Encounter (Signed)
Lab results reviewed with patient who verbalized understanding. I have forwarded copies to PCP.

## 2016-12-16 ENCOUNTER — Ambulatory Visit: Payer: Medicare HMO | Admitting: Cardiovascular Disease

## 2017-01-09 DIAGNOSIS — H26493 Other secondary cataract, bilateral: Secondary | ICD-10-CM | POA: Diagnosis not present

## 2017-01-09 DIAGNOSIS — H52203 Unspecified astigmatism, bilateral: Secondary | ICD-10-CM | POA: Diagnosis not present

## 2017-01-09 DIAGNOSIS — H1851 Endothelial corneal dystrophy: Secondary | ICD-10-CM | POA: Diagnosis not present

## 2017-01-19 ENCOUNTER — Telehealth (HOSPITAL_COMMUNITY): Payer: Self-pay

## 2017-01-19 ENCOUNTER — Other Ambulatory Visit (HOSPITAL_COMMUNITY): Payer: Self-pay | Admitting: Interventional Radiology

## 2017-01-19 DIAGNOSIS — I729 Aneurysm of unspecified site: Secondary | ICD-10-CM

## 2017-01-19 NOTE — Telephone Encounter (Signed)
Called to schedule 2 yr f/u cta head, left message for pt to return call. AW

## 2017-01-26 ENCOUNTER — Ambulatory Visit (HOSPITAL_COMMUNITY)
Admission: RE | Admit: 2017-01-26 | Discharge: 2017-01-26 | Disposition: A | Discharge status: Home / Self Care | Payer: Medicare HMO | Source: Ambulatory Visit | Attending: Interventional Radiology | Admitting: Interventional Radiology

## 2017-01-26 DIAGNOSIS — I729 Aneurysm of unspecified site: Secondary | ICD-10-CM

## 2017-01-26 DIAGNOSIS — I671 Cerebral aneurysm, nonruptured: Secondary | ICD-10-CM | POA: Insufficient documentation

## 2017-01-26 LAB — POCT I-STAT CREATININE: Creatinine, Ser: 0.7 mg/dL (ref 0.44–1.00)

## 2017-01-26 MED ORDER — IOPAMIDOL (ISOVUE-370) INJECTION 76%
INTRAVENOUS | Status: AC
  Administered 2017-01-26: 50 mL
  Filled 2017-01-26: qty 50

## 2017-01-30 ENCOUNTER — Telehealth (HOSPITAL_COMMUNITY): Payer: Self-pay

## 2017-01-30 NOTE — Telephone Encounter (Signed)
Left message for pt to return call. AW 

## 2017-02-28 ENCOUNTER — Encounter: Payer: Self-pay | Admitting: Cardiovascular Disease

## 2017-02-28 ENCOUNTER — Ambulatory Visit: Payer: Medicare HMO | Admitting: Cardiovascular Disease

## 2017-02-28 VITALS — BP 104/54 | HR 72 | Ht 65.0 in | Wt 141.1 lb

## 2017-02-28 DIAGNOSIS — I10 Essential (primary) hypertension: Secondary | ICD-10-CM

## 2017-02-28 DIAGNOSIS — I2510 Atherosclerotic heart disease of native coronary artery without angina pectoris: Secondary | ICD-10-CM | POA: Diagnosis not present

## 2017-02-28 DIAGNOSIS — E782 Mixed hyperlipidemia: Secondary | ICD-10-CM | POA: Diagnosis not present

## 2017-02-28 NOTE — Progress Notes (Signed)
[name] Date of Birth  1978-09-14 [facility] [address]    [place], [place]  [address] [phone]  Fax  [phone]  Problem list: 1. Coronary artery disease-status post PTCA cutting balloon to the first diagonal 2. Hypertension 3. Hyperlipidemia 4.  peripheral vascular disease-status post right carotid endarterectomy 5. cerebral aneurysm-status post coil embolization- 2008     [name] is a 84 year old female with a history of coronary artery disease. She is  status post PTCA and cutting balloon procedure to her first diagonal artery. She also has a history of hypertension, hyperlipidemia and right carotid endarterectomy.  She denies any episodes of chest pain or shortness of breath.   She has had an occasional episode of lightheadedness.   She has not had any angina.  She complains of cold feet at night.  Jun 01, 2012:  [name] is doing OK - occasional indigestion. Denies any CP or dyspnea.     Several weeks ago she had a presyncopal episode while driving her car.  It ~ 5 seconds and resolved.   Dec. 2, 2014:  [name] is doing well. She's not had any further episodes of syncope or presyncope.  She exercises on occasion.  June 04, 2013:  [name] is upset today.  Her daughter has severe mental retardation and is in a group home and the group home is closing.    She is doing OK from a cardiac standpoint.    She has not had a chance to do much exercise or other activities.    Dec. 15, 2015:  [name] is a 84 yo with hx of CAD, HTN,  PVD, hyperlipidemia.  No CP or dyspnea.  Sept. 29, 2016:   No CP or dyspnea.  Doing well overall   April 23, 2015:  Feels well  She was having some chest pain earlier this year. A stress Myoview study revealed no evidence of ischemia. Left ventricular systolic function is normal.  Oct. 18, 2017: No CP , has some DOE Feels "woozy" in the am .  Improves after she gets going .  No angina .   May 04, 2016:  [name] is seen today for  follow up of her CAD, HTN, hyperlipidemia and carotid artery disease.  Doing well from a cardiac standpoint . Arthritis pain in feet. Able to do some exercise   February 28, 2017: [name] is seen back today for follow-up of her coronary artery disease, hypertension, hyperlipidemia, and mild carotid artery disease. Is slowing down.  No CP or dyspnea.     Current Outpatient Medications on File Prior to Visit  Medication Sig Dispense Refill  . ALPRAZolam (XANAX) 0.5 MG tablet Take 0.25 mg by mouth daily as needed for anxiety.     Marland Kitchen aspirin EC 81 MG tablet Take 81 mg by mouth daily.     Marland Kitchen atorvastatin (LIPITOR) 40 MG tablet TAKE 1 TABLET EVERY DAY 90 tablet 2  . Calcium-Magnesium-Vitamin D (CALCIUM 1200+D3 PO) Take 1 tablet by mouth 2 (two) times daily. Vitamin D3 1000 units    . clonazePAM (KLONOPIN) 0.5 MG tablet Take 0.5 mg by mouth at bedtime as needed (sleep).     . DULoxetine (CYMBALTA) 30 MG capsule Take 30 mg by mouth daily.    Marland Kitchen FLUZONE HIGH-DOSE 0.5 ML SUSY Inject as directed once.    . hydrochlorothiazide (HYDRODIURIL) 25 MG tablet TAKE 1 TABLET EVERY DAY 90 tablet 3  . KLOR-CON SPRINKLE 10 MEQ CR capsule TAKE 1 CAPSULE EVERY  DAY 90 capsule 2  . lisinopril (PRINIVIL,ZESTRIL) 20 MG tablet TAKE 1 TABLET EVERY DAY 90 tablet 1  . naproxen sodium (ALEVE) 220 MG tablet Take 220 mg by mouth daily as needed (arthritis pain).    . nitroGLYCERIN (NITROSTAT) 0.4 MG SL tablet Place 1 tablet (0.4 mg total) under the tongue every 5 (five) minutes as needed for chest pain (for up to 3 doses). 25 tablet 6  . valACYclovir (VALTREX) 1000 MG tablet Take 2,000 mg by mouth once as needed (breakouts).      No current facility-administered medications on file prior to visit.     Allergies  Allergen Reactions  . Bystolic [Nebivolol Hcl] Other (See Comments)    Mouth sores  . Codeine Other (See Comments)    Strange feeling per patient    Past Medical History:  Diagnosis Date  . Coronary artery  disease   . Hyperlipemia   . Hypertension   . S/P carotid endarterectomy    RIGHT CAROTID    Past Surgical History:  Procedure Laterality Date  . CAROTID ENDARTERECTOMY    . CORONARY ANGIOPLASTY WITH STENT PLACEMENT     FIRST DIAGONAL    Social History   Tobacco Use  Smoking Status Never Smoker  Smokeless Tobacco Never Used    Social History   Substance and Sexual Activity  Alcohol Use No    Family History  Problem Relation Age of Onset  . Stroke Father        DECEASED  . Stroke Mother        DECEASED  . Hypertension Mother        DECEASED  . Stroke Brother     Reviw of Systems:  Reviewed in the HPI.  All other systems are negative.  Physical Exam: Blood pressure (!) 104/54, pulse 72, height 5\' 5"  (1.651 m), weight 141 lb 1.9 oz (64 kg).  GEN:  Well nourished, well developed in no acute distress HEENT: Normal NECK: No JVD; No carotid bruits LYMPHATICS: No lymphadenopathy CARDIAC: RRR , no murmurs, rubs, gallops RESPIRATORY:  Clear to auscultation without rales, wheezing or rhonchi  ABDOMEN: Soft, non-tender, non-distended MUSCULOSKELETAL:  No edema; No deformity  SKIN: Warm and dry NEUROLOGIC:  Alert and oriented x 3  ECG: Feb.   26, 2019: Normal sinus rhythm at 72 beats a minute.  Right bundle branch block.  Possible septal infarct    Assessment / Plan:   1. Coronary artery disease-status post PTCA cutting balloon to the first diagonal - Doing well.  No angina. . 2. Hypertension-   blood pressure is well controlled.  3. Hyperlipidemia-lipids have been stable.  We will draw them again when I see her again in 6 months.  4.  peripheral vascular disease-status post right carotid endarterectomy - no carotid bruits   5. cerebral aneurysm-status post coil embolization- 2008    [name], MD  02/28/2017 3:34 PM    [facility] [place],  [place], [place]  [address] Pager [phone] Phone: [phone];  Fax: [phone]

## 2017-02-28 NOTE — Patient Instructions (Signed)

## 2017-03-16 ENCOUNTER — Other Ambulatory Visit: Payer: Self-pay | Admitting: Cardiovascular Disease

## 2017-03-20 DIAGNOSIS — R1011 Right upper quadrant pain: Secondary | ICD-10-CM | POA: Diagnosis not present

## 2017-03-20 DIAGNOSIS — S39012A Strain of muscle, fascia and tendon of lower back, initial encounter: Secondary | ICD-10-CM | POA: Diagnosis not present

## 2017-04-05 DIAGNOSIS — M549 Dorsalgia, unspecified: Secondary | ICD-10-CM | POA: Diagnosis not present

## 2017-04-27 DIAGNOSIS — E7800 Pure hypercholesterolemia, unspecified: Secondary | ICD-10-CM | POA: Diagnosis not present

## 2017-04-27 DIAGNOSIS — I671 Cerebral aneurysm, nonruptured: Secondary | ICD-10-CM | POA: Diagnosis not present

## 2017-04-27 DIAGNOSIS — M810 Age-related osteoporosis without current pathological fracture: Secondary | ICD-10-CM | POA: Diagnosis not present

## 2017-04-27 DIAGNOSIS — T169XXA Foreign body in ear, unspecified ear, initial encounter: Secondary | ICD-10-CM | POA: Diagnosis not present

## 2017-04-27 DIAGNOSIS — R319 Hematuria, unspecified: Secondary | ICD-10-CM | POA: Diagnosis not present

## 2017-04-27 DIAGNOSIS — F419 Anxiety disorder, unspecified: Secondary | ICD-10-CM | POA: Diagnosis not present

## 2017-04-27 DIAGNOSIS — Z0000 Encounter for general adult medical examination without abnormal findings: Secondary | ICD-10-CM | POA: Diagnosis not present

## 2017-04-27 DIAGNOSIS — I700 Atherosclerosis of aorta: Secondary | ICD-10-CM | POA: Diagnosis not present

## 2017-04-27 DIAGNOSIS — I10 Essential (primary) hypertension: Secondary | ICD-10-CM | POA: Diagnosis not present

## 2017-04-28 ENCOUNTER — Other Ambulatory Visit: Payer: Self-pay | Admitting: Family Medicine

## 2017-04-28 DIAGNOSIS — R109 Unspecified abdominal pain: Secondary | ICD-10-CM

## 2017-05-01 ENCOUNTER — Ambulatory Visit
Admission: RE | Admit: 2017-05-01 | Discharge: 2017-05-01 | Disposition: A | Discharge status: Home / Self Care | Payer: Medicare HMO | Source: Ambulatory Visit | Attending: Family Medicine | Admitting: Family Medicine

## 2017-05-01 DIAGNOSIS — R109 Unspecified abdominal pain: Secondary | ICD-10-CM

## 2017-05-01 DIAGNOSIS — H903 Sensorineural hearing loss, bilateral: Secondary | ICD-10-CM | POA: Diagnosis not present

## 2017-05-01 DIAGNOSIS — Z974 Presence of external hearing-aid: Secondary | ICD-10-CM | POA: Diagnosis not present

## 2017-05-01 DIAGNOSIS — T161XXA Foreign body in right ear, initial encounter: Secondary | ICD-10-CM | POA: Diagnosis not present

## 2017-05-01 DIAGNOSIS — R3129 Other microscopic hematuria: Secondary | ICD-10-CM | POA: Diagnosis not present

## 2017-05-04 ENCOUNTER — Other Ambulatory Visit: Payer: Self-pay | Admitting: Family Medicine

## 2017-05-04 DIAGNOSIS — N839 Noninflammatory disorder of ovary, fallopian tube and broad ligament, unspecified: Secondary | ICD-10-CM

## 2017-05-05 ENCOUNTER — Ambulatory Visit
Admission: RE | Admit: 2017-05-05 | Discharge: 2017-05-05 | Disposition: A | Discharge status: Home / Self Care | Payer: Medicare HMO | Source: Ambulatory Visit | Attending: Family Medicine | Admitting: Family Medicine

## 2017-05-05 DIAGNOSIS — N83201 Unspecified ovarian cyst, right side: Secondary | ICD-10-CM | POA: Diagnosis not present

## 2017-05-05 DIAGNOSIS — N83202 Unspecified ovarian cyst, left side: Secondary | ICD-10-CM | POA: Diagnosis not present

## 2017-05-05 DIAGNOSIS — N839 Noninflammatory disorder of ovary, fallopian tube and broad ligament, unspecified: Secondary | ICD-10-CM

## 2017-05-10 ENCOUNTER — Other Ambulatory Visit: Payer: Self-pay | Admitting: Cardiovascular Disease

## 2017-05-17 ENCOUNTER — Other Ambulatory Visit: Payer: Self-pay | Admitting: Cardiovascular Disease

## 2017-05-17 DIAGNOSIS — R109 Unspecified abdominal pain: Secondary | ICD-10-CM | POA: Diagnosis not present

## 2017-05-17 NOTE — Telephone Encounter (Signed)
Outpatient Medication Detail    Disp Refills Start End   lisinopril (PRINIVIL,ZESTRIL) 20 MG tablet 90 tablet 2 05/10/2017    Sig - Route: Take 1 tablet (20 mg total) by mouth daily. - Oral   Sent to pharmacy as: lisinopril (PRINIVIL,ZESTRIL) 20 MG tablet   E-Prescribing Status: Receipt confirmed by pharmacy (05/10/2017 1:57 PM EDT)   Pharmacy   [facility] [place], [place]

## 2017-08-03 ENCOUNTER — Other Ambulatory Visit: Payer: Self-pay | Admitting: Cardiovascular Disease

## 2017-08-03 DIAGNOSIS — I6523 Occlusion and stenosis of bilateral carotid arteries: Secondary | ICD-10-CM

## 2017-08-14 ENCOUNTER — Ambulatory Visit (HOSPITAL_COMMUNITY)
Admission: RE | Admit: 2017-08-14 | Discharge: 2017-08-14 | Disposition: A | Discharge status: Home / Self Care | Payer: Medicare HMO | Source: Ambulatory Visit | Attending: Cardiology | Admitting: Cardiology

## 2017-08-14 DIAGNOSIS — I6523 Occlusion and stenosis of bilateral carotid arteries: Secondary | ICD-10-CM | POA: Diagnosis not present

## 2017-08-14 DIAGNOSIS — Z1231 Encounter for screening mammogram for malignant neoplasm of breast: Secondary | ICD-10-CM | POA: Diagnosis not present

## 2017-08-17 ENCOUNTER — Other Ambulatory Visit: Payer: Self-pay | Admitting: Cardiovascular Disease

## 2017-08-17 NOTE — Telephone Encounter (Signed)
Prescribing Provider Encounter Provider  Nahser, Wonda Cheng, MD Nahser, Wonda Cheng, MD  Outpatient Medication Detail    Disp Refills Start End   atorvastatin (LIPITOR) 40 MG tablet 90 tablet 2 03/16/2017    Sig: TAKE 1 TABLET EVERY DAY   Sent to pharmacy as: atorvastatin (LIPITOR) 40 MG tablet   E-Prescribing Status: Receipt confirmed by pharmacy (03/16/2017 2:40 PM EDT)   Pharmacy   [facility] [place], [place]

## 2017-08-22 ENCOUNTER — Other Ambulatory Visit: Payer: Medicare HMO

## 2017-08-22 DIAGNOSIS — I2510 Atherosclerotic heart disease of native coronary artery without angina pectoris: Secondary | ICD-10-CM | POA: Diagnosis not present

## 2017-08-22 DIAGNOSIS — I10 Essential (primary) hypertension: Secondary | ICD-10-CM

## 2017-08-22 DIAGNOSIS — E782 Mixed hyperlipidemia: Secondary | ICD-10-CM | POA: Diagnosis not present

## 2017-08-22 LAB — BASIC METABOLIC PANEL
BUN/Creatinine Ratio: 27 (ref 12–28)
BUN: 17 mg/dL (ref 8–27)
CO2: 25 mmol/L (ref 20–29)
Calcium: 9.5 mg/dL (ref 8.7–10.3)
Chloride: 98 mmol/L (ref 96–106)
Creatinine, Ser: 0.64 mg/dL (ref 0.57–1.00)
GFR calc Af Amer: 94 mL/min/{1.73_m2} (ref >59)
GFR calc non Af Amer: 82 mL/min/{1.73_m2} (ref >59)
Glucose: 97 mg/dL (ref 65–99)
Potassium: 4.2 mmol/L (ref 3.5–5.2)
Sodium: 136 mmol/L (ref 134–144)

## 2017-08-22 LAB — LIPID PANEL
Chol/HDL Ratio: 1.9 ratio (ref 0.0–4.4)
Cholesterol, Total: 134 mg/dL (ref 100–199)
HDL: 71 mg/dL (ref >39)
LDL Calculated: 54 mg/dL (ref 0–99)
Triglycerides: 47 mg/dL (ref 0–149)
VLDL Cholesterol Cal: 9 mg/dL (ref 5–40)

## 2017-08-22 LAB — HEPATIC FUNCTION PANEL
ALT: 13 IU/L (ref 0–32)
AST: 20 IU/L (ref 0–40)
Albumin: 4.1 g/dL (ref 3.5–4.7)
Alkaline Phosphatase: 58 IU/L (ref 39–117)
Bilirubin Total: 0.6 mg/dL (ref 0.0–1.2)
Bilirubin, Direct: 0.21 mg/dL (ref 0.00–0.40)
Total Protein: 6.6 g/dL (ref 6.0–8.5)

## 2017-08-27 NOTE — Progress Notes (Signed)
[name] Date of Birth  06/17/1930 [facility] [address]    [place], [place]  [address] [phone]  Fax  [phone]  Problem list: 1. Coronary artery disease-status post PTCA cutting balloon to the first diagonal 2. Hypertension 3. Hyperlipidemia 4.  peripheral vascular disease-status post right carotid endarterectomy 5. cerebral aneurysm-status post coil embolization- 2008     Di is a 84 year old female with a history of coronary artery disease. She is  status post PTCA and cutting balloon procedure to her first diagonal artery. She also has a history of hypertension, hyperlipidemia and right carotid endarterectomy.  She denies any episodes of chest pain or shortness of breath.   She has had an occasional episode of lightheadedness.   She has not had any angina.  She complains of cold feet at night.  Jun 01, 2012:  [name] is doing OK - occasional indigestion. Denies any CP or dyspnea.     Several weeks ago she had a presyncopal episode while driving her car.  It ~ 5 seconds and resolved.   Dec. 2, 2014:  [name] is doing well. She's not had any further episodes of syncope or presyncope.  She exercises on occasion.  June 04, 2013:  [name] is upset today.  Her daughter has severe mental retardation and is in a group home and the group home is closing.    She is doing OK from a cardiac standpoint.    She has not had a chance to do much exercise or other activities.    Dec. 15, 2015:  [name] is a 84 yo with hx of CAD, HTN,  PVD, hyperlipidemia.  No CP or dyspnea.  Sept. 29, 2016:   No CP or dyspnea.  Doing well overall   April 23, 2015:  Feels well  She was having some chest pain earlier this year. A stress Myoview study revealed no evidence of ischemia. Left ventricular systolic function is normal.  Oct. 18, 2017: No CP , has some DOE Feels "woozy" in the am .  Improves after she gets going .  No angina .   May 04, 2016:  [name] is seen today for  follow up of her CAD, HTN, hyperlipidemia and carotid artery disease.  Doing well from a cardiac standpoint . Arthritis pain in feet. Able to do some exercise   February 28, 2017: [name] is seen back today for follow-up of her coronary artery disease, hypertension, hyperlipidemia, and mild carotid artery disease. Is slowing down.  No CP or dyspnea.      Aug. 26, 2019  [name] is seen today Her Husband,  [name] passed away since I last saw her.  She is very sad but is doing well from a cardiac standpoint .   No CP or dyspnea    Current Outpatient Medications on File Prior to Visit  Medication Sig Dispense Refill  . ALPRAZolam (XANAX) 0.5 MG tablet Take 0.25 mg by mouth daily as needed for anxiety.     Marland Kitchen aspirin EC 81 MG tablet Take 81 mg by mouth daily.     Marland Kitchen atorvastatin (LIPITOR) 40 MG tablet TAKE 1 TABLET EVERY DAY 90 tablet 1  . Calcium-Magnesium-Vitamin D (CALCIUM 1200+D3 PO) Take 1 tablet by mouth 2 (two) times daily. Vitamin D3 1000 units    . clonazePAM (KLONOPIN) 0.5 MG tablet Take 0.5 mg by mouth at bedtime as needed (sleep).     . DULoxetine (CYMBALTA) 30 MG capsule Take 30 mg by mouth  daily.    Marland Kitchen FLUZONE HIGH-DOSE 0.5 ML SUSY Inject as directed once.    Derrill Memo ON 12/17/2017] hydrochlorothiazide (HYDRODIURIL) 25 MG tablet TAKE 1 TABLET EVERY DAY 90 tablet 0  . lisinopril (PRINIVIL,ZESTRIL) 20 MG tablet Take 1 tablet (20 mg total) by mouth daily. 90 tablet 2  . naproxen sodium (ALEVE) 220 MG tablet Take 220 mg by mouth daily as needed (arthritis pain).    . nitroGLYCERIN (NITROSTAT) 0.4 MG SL tablet Place 1 tablet (0.4 mg total) under the tongue every 5 (five) minutes as needed for chest pain (for up to 3 doses). 25 tablet 6  . potassium chloride (MICRO-K) 10 MEQ CR capsule TAKE 1 CAPSULE EVERY DAY 90 capsule 1  . valACYclovir (VALTREX) 1000 MG tablet Take 2,000 mg by mouth once as needed (breakouts).      No current facility-administered medications on file prior to visit.      Allergies  Allergen Reactions  . Bystolic [Nebivolol Hcl] Other (See Comments)    Mouth sores  . Codeine Other (See Comments)    Strange feeling per patient    Past Medical History:  Diagnosis Date  . Coronary artery disease   . Hyperlipemia   . Hypertension   . S/P carotid endarterectomy    RIGHT CAROTID    Past Surgical History:  Procedure Laterality Date  . CAROTID ENDARTERECTOMY    . CORONARY ANGIOPLASTY WITH STENT PLACEMENT     FIRST DIAGONAL    Social History   Tobacco Use  Smoking Status Never Smoker  Smokeless Tobacco Never Used    Social History   Substance and Sexual Activity  Alcohol Use No    Family History  Problem Relation Age of Onset  . Stroke Father        DECEASED  . Stroke Mother        DECEASED  . Hypertension Mother        DECEASED  . Stroke Brother     Reviw of Systems:  Reviewed in the HPI.  All other systems are negative.  Physical Exam: There were no vitals taken for this visit.  GEN:   Elderly female, NAD  HEENT: Normal NECK: No JVD; No carotid bruits LYMPHATICS: No lymphadenopathy CARDIAC: RR, no murmurs, rubs, gallops RESPIRATORY:  Clear to auscultation without rales, wheezing or rhonchi  ABDOMEN: Soft, non-tender, non-distended MUSCULOSKELETAL:  No edema; No deformity  SKIN: Warm and dry NEUROLOGIC:  Alert and oriented x 3  ECG:   Assessment / Plan:   1. Coronary artery disease-status post PTCA cutting balloon to the first diagonal - No angina   . 2. Hypertension-blood pressure is well controlled.  3. Hyperlipidemia- recent labs look great   4.  peripheral vascular disease-status post carotid endarterectomy.  She seems to be very stable.  5. cerebral aneurysm-status post coil embolization- 2008    [name], MD  82/25/2019 8:47 PM    [facility] [place],  [place], [place]  [address] Pager [phone] Phone: [phone]; Fax: [phone]

## 2017-08-28 ENCOUNTER — Ambulatory Visit: Payer: Medicare HMO | Admitting: Cardiovascular Disease

## 2017-08-28 ENCOUNTER — Encounter: Payer: Self-pay | Admitting: Cardiovascular Disease

## 2017-08-28 VITALS — BP 102/62 | HR 70 | Ht 65.0 in | Wt 135.0 lb

## 2017-08-28 DIAGNOSIS — I10 Essential (primary) hypertension: Secondary | ICD-10-CM

## 2017-08-28 DIAGNOSIS — E7800 Pure hypercholesterolemia, unspecified: Secondary | ICD-10-CM | POA: Diagnosis not present

## 2017-08-28 DIAGNOSIS — I2510 Atherosclerotic heart disease of native coronary artery without angina pectoris: Secondary | ICD-10-CM

## 2017-08-28 NOTE — Patient Instructions (Signed)
Medication Instructions:  Your physician recommends that you continue on your current medications as directed. Please refer to the Current Medication list given to you today.   Labwork: None Ordered   Testing/Procedures: None Ordered   Follow-Up: Your physician wants you to follow-up in: 6 months with Pecolia Ades, NP or another member of Dr. Elmarie Shiley team. Dennis Bast will receive a reminder letter in the mail two months in advance. If you don't receive a letter, please call our office to schedule the follow-up appointment.   If you need a refill on your cardiac medications before your next appointment, please call your pharmacy.   Thank you for choosing [facility]! Christen Bame, RN [phone]

## 2017-10-24 ENCOUNTER — Other Ambulatory Visit: Payer: Self-pay | Admitting: Cardiovascular Disease

## 2017-10-27 DIAGNOSIS — H1851 Endothelial corneal dystrophy: Secondary | ICD-10-CM | POA: Diagnosis not present

## 2017-10-27 DIAGNOSIS — H26493 Other secondary cataract, bilateral: Secondary | ICD-10-CM | POA: Diagnosis not present

## 2017-11-06 DIAGNOSIS — E7800 Pure hypercholesterolemia, unspecified: Secondary | ICD-10-CM | POA: Diagnosis not present

## 2017-11-06 DIAGNOSIS — I10 Essential (primary) hypertension: Secondary | ICD-10-CM | POA: Diagnosis not present

## 2017-11-06 DIAGNOSIS — I700 Atherosclerosis of aorta: Secondary | ICD-10-CM | POA: Diagnosis not present

## 2017-11-06 DIAGNOSIS — F419 Anxiety disorder, unspecified: Secondary | ICD-10-CM | POA: Diagnosis not present

## 2017-11-23 DIAGNOSIS — H26491 Other secondary cataract, right eye: Secondary | ICD-10-CM | POA: Diagnosis not present

## 2018-01-25 ENCOUNTER — Encounter: Payer: Self-pay | Admitting: Cardiovascular Disease

## 2018-02-13 ENCOUNTER — Encounter: Payer: Self-pay | Admitting: Cardiovascular Disease

## 2018-02-13 ENCOUNTER — Ambulatory Visit: Payer: Medicare HMO | Admitting: Cardiovascular Disease

## 2018-02-13 VITALS — BP 94/58 | HR 74 | Ht 65.0 in | Wt 134.8 lb

## 2018-02-13 DIAGNOSIS — I2510 Atherosclerotic heart disease of native coronary artery without angina pectoris: Secondary | ICD-10-CM

## 2018-02-13 DIAGNOSIS — E782 Mixed hyperlipidemia: Secondary | ICD-10-CM

## 2018-02-13 LAB — BASIC METABOLIC PANEL
BUN/Creatinine Ratio: 29 — ABNORMAL HIGH (ref 12–28)
BUN: 22 mg/dL (ref 8–27)
CO2: 24 mmol/L (ref 20–29)
Calcium: 10.1 mg/dL (ref 8.7–10.3)
Chloride: 96 mmol/L (ref 96–106)
Creatinine, Ser: 0.75 mg/dL (ref 0.57–1.00)
GFR calc Af Amer: 84 mL/min/{1.73_m2} (ref >59)
GFR calc non Af Amer: 73 mL/min/{1.73_m2} (ref >59)
Glucose: 91 mg/dL (ref 65–99)
Potassium: 4.3 mmol/L (ref 3.5–5.2)
Sodium: 138 mmol/L (ref 134–144)

## 2018-02-13 LAB — HEPATIC FUNCTION PANEL
ALT: 15 IU/L (ref 0–32)
AST: 23 IU/L (ref 0–40)
Albumin: 4.4 g/dL (ref 3.6–4.6)
Alkaline Phosphatase: 60 IU/L (ref 39–117)
Bilirubin Total: 0.8 mg/dL (ref 0.0–1.2)
Bilirubin, Direct: 0.21 mg/dL (ref 0.00–0.40)
Total Protein: 7 g/dL (ref 6.0–8.5)

## 2018-02-13 LAB — LIPID PANEL
Chol/HDL Ratio: 2 ratio (ref 0.0–4.4)
Cholesterol, Total: 137 mg/dL (ref 100–199)
HDL: 69 mg/dL (ref >39)
LDL Calculated: 57 mg/dL (ref 0–99)
Triglycerides: 54 mg/dL (ref 0–149)
VLDL Cholesterol Cal: 11 mg/dL (ref 5–40)

## 2018-02-13 NOTE — Patient Instructions (Signed)
Medication Instructions:  Your physician recommends that you continue on your current medications as directed. Please refer to the Current Medication list given to you today.  If you need a refill on your cardiac medications before your next appointment, please call your pharmacy.    Lab work: TODAY - cholesterol, liver panel, basic metabolic panel  If you have labs (blood work) drawn today and your tests are completely normal, you will receive your results only by: . MyChart Message (if you have MyChart) OR . A paper copy in the mail If you have any lab test that is abnormal or we need to change your treatment, we will call you to review the results.   Testing/Procedures: None Ordered   Follow-Up: At [facility], you and your health needs are our priority.  As part of our continuing mission to provide you with exceptional heart care, we have created designated Provider Care Teams.  These Care Teams include your primary Cardiologist (physician) and Advanced Practice Providers (APPs -  Physician Assistants and Nurse Practitioners) who all work together to provide you with the care you need, when you need it. You will need a follow up appointment in:  6 months.  Please call our office 2 months in advance to schedule this appointment.  You may see Philip Nahser, MD or one of the following Advanced Practice Providers on your designated Care Team: Scott Weaver, PA-C Vin Bhagat, PA-C . Janine Hammond, NP   

## 2018-02-13 NOTE — Progress Notes (Signed)
[name] Date of Birth  02-02-1930 [name] [address]    [place], [place]  [address] [phone]  Fax  [phone]  Problem list: 1. Coronary artery disease-status post PTCA cutting balloon to the first diagonal 2. Hypertension 3. Hyperlipidemia 4.  peripheral vascular disease-status post right carotid endarterectomy 5. cerebral aneurysm-status post coil embolization- 2008     [name] is a 85 year old female with a history of coronary artery disease. She is  status post PTCA and cutting balloon procedure to her first diagonal artery. She also has a history of hypertension, hyperlipidemia and right carotid endarterectomy.  She denies any episodes of chest pain or shortness of breath.   She has had an occasional episode of lightheadedness.   She has not had any angina.  She complains of cold feet at night.  Jun 01, 2012:  [name] is doing OK - occasional indigestion. Denies any CP or dyspnea.     Several weeks ago she had a presyncopal episode while driving her car.  It ~ 5 seconds and resolved.   Dec. 2, 2014:  [name] is doing well. She's not had any further episodes of syncope or presyncope.  She exercises on occasion.  June 04, 2013:  [name] is upset today.  Her daughter has severe mental retardation and is in a group home and the group home is closing.    She is doing OK from a cardiac standpoint.    She has not had a chance to do much exercise or other activities.    Dec. 15, 2015:  [name] is a 85 yo with hx of CAD, HTN,  PVD, hyperlipidemia.  No CP or dyspnea.  Sept. 29, 2016:   No CP or dyspnea.  Doing well overall   April 23, 2015:  Feels well  She was having some chest pain earlier this year. A stress Myoview study revealed no evidence of ischemia. Left ventricular systolic function is normal.  Oct. 18, 2017: No CP , has some DOE Feels "woozy" in the am .  Improves after she gets going .  No angina .   May 04, 2016:  [name] is seen today for  follow up of her CAD, HTN, hyperlipidemia and carotid artery disease.  Doing well from a cardiac standpoint . Arthritis pain in feet. Able to do some exercise   February 28, 2017: [name] is seen back today for follow-up of her coronary artery disease, hypertension, hyperlipidemia, and mild carotid artery disease. Is slowing down.  No CP or dyspnea.      Aug. 26, 2019  [name] is seen today Her Husband,  [name] passed away since I last saw her.  She is very sad but is doing well from a cardiac standpoint .   No CP or dyspnea   Feb. 11, 2020:  No CP,  Has DOE with activity .  Not able to do all that she used to be able to do.  She has not had breakfast yet today.  This may explain her low blood pressure.  Current Outpatient Medications on File Prior to Visit  Medication Sig Dispense Refill  . ALPRAZolam (XANAX) 0.5 MG tablet Take 0.25 mg by mouth daily as needed for anxiety.     Marland Kitchen aspirin EC 81 MG tablet Take 81 mg by mouth daily.     Marland Kitchen atorvastatin (LIPITOR) 40 MG tablet Take 1 tablet by mouth daily.    . Calcium-Magnesium-Vitamin D (CALCIUM 1200+D3 PO) Take 1 tablet by mouth  2 (two) times daily. Vitamin D3 1000 units    . clonazePAM (KLONOPIN) 0.5 MG tablet Take 0.5 mg by mouth at bedtime as needed (sleep).     . DULoxetine (CYMBALTA) 30 MG capsule Take 30 mg by mouth daily.    Marland Kitchen FLUZONE HIGH-DOSE 0.5 ML SUSY Inject as directed once.    . hydrochlorothiazide (HYDRODIURIL) 25 MG tablet TAKE 1 TABLET EVERY DAY 90 tablet 3  . lisinopril (PRINIVIL,ZESTRIL) 20 MG tablet Take 1 tablet (20 mg total) by mouth daily. 90 tablet 2  . naproxen sodium (ALEVE) 220 MG tablet Take 220 mg by mouth daily as needed (arthritis pain).    . nitroGLYCERIN (NITROSTAT) 0.4 MG SL tablet Place 1 tablet (0.4 mg total) under the tongue every 5 (five) minutes as needed for chest pain (for up to 3 doses). 25 tablet 6  . potassium chloride (MICRO-K) 10 MEQ CR capsule Take 1 capsule by mouth daily.    . valACYclovir  (VALTREX) 1000 MG tablet Take 2,000 mg by mouth once as needed (breakouts).      No current facility-administered medications on file prior to visit.     Allergies  Allergen Reactions  . Bystolic [Nebivolol Hcl] Other (See Comments)    Mouth sores  . Codeine Other (See Comments)    Strange feeling per patient Strange feeling per patient    Past Medical History:  Diagnosis Date  . Coronary artery disease   . Hyperlipemia   . Hypertension   . S/P carotid endarterectomy    RIGHT CAROTID    Past Surgical History:  Procedure Laterality Date  . CAROTID ENDARTERECTOMY    . CORONARY ANGIOPLASTY WITH STENT PLACEMENT     FIRST DIAGONAL    Social History   Tobacco Use  Smoking Status Never Smoker  Smokeless Tobacco Never Used    Social History   Substance and Sexual Activity  Alcohol Use No    Family History  Problem Relation Age of Onset  . Stroke Father        DECEASED  . Stroke Mother        DECEASED  . Hypertension Mother        DECEASED  . Stroke Brother     Reviw of Systems:  Reviewed in the HPI.  All other systems are negative.  Physical Exam: Blood pressure (!) 94/58, pulse 74, height 5\' 5"  (1.651 m), weight 134 lb 12.8 oz (61.1 kg), SpO2 99 %.  GEN:   Elderly female, NAD  HEENT: Normal NECK: No JVD; No carotid bruits LYMPHATICS: No lymphadenopathy CARDIAC: RRR   RESPIRATORY:  Clear to auscultation without rales, wheezing or rhonchi  ABDOMEN: Soft, non-tender, non-distended MUSCULOSKELETAL:  No edema; No deformity  SKIN: Warm and dry NEUROLOGIC:  Alert and oriented x 3   ECG:   Assessment / Plan:   1. Coronary artery disease-status post PTCA cutting balloon to the first diagonal -  no angina.  She seems to be doing well.  . 2. Hypertension-pressures well controlled and in fact is a little bit low today.  She has not fast today which likely explain some of her hypertension. 3. Hyperlipidemia-  .        contninue meds.   4.  peripheral  vascular disease-   5. cerebral aneurysm-status post coil embolization- 2008     [name], MD  02/13/2018 9:43 AM    [facility] [place],  [place], [place]  [address] Pager [phone] Phone: [phone]; Fax: [phone]

## 2018-02-14 ENCOUNTER — Other Ambulatory Visit: Payer: Self-pay | Admitting: Cardiovascular Disease

## 2018-03-19 ENCOUNTER — Other Ambulatory Visit: Payer: Self-pay | Admitting: Cardiovascular Disease

## 2018-05-08 ENCOUNTER — Other Ambulatory Visit: Payer: Self-pay | Admitting: Family Medicine

## 2018-05-08 DIAGNOSIS — N83209 Unspecified ovarian cyst, unspecified side: Secondary | ICD-10-CM

## 2018-05-30 ENCOUNTER — Ambulatory Visit
Admission: RE | Admit: 2018-05-30 | Discharge: 2018-05-30 | Disposition: A | Discharge status: Home / Self Care | Payer: Medicare HMO | Source: Ambulatory Visit | Attending: Family Medicine | Admitting: Family Medicine

## 2018-05-30 DIAGNOSIS — N83201 Unspecified ovarian cyst, right side: Secondary | ICD-10-CM | POA: Diagnosis not present

## 2018-05-30 DIAGNOSIS — N83209 Unspecified ovarian cyst, unspecified side: Secondary | ICD-10-CM

## 2018-06-06 ENCOUNTER — Other Ambulatory Visit: Payer: Self-pay | Admitting: Cardiovascular Disease

## 2018-06-07 ENCOUNTER — Other Ambulatory Visit: Payer: Medicare HMO

## 2018-07-27 ENCOUNTER — Other Ambulatory Visit: Payer: Self-pay

## 2018-07-27 DIAGNOSIS — Z20822 Contact with and (suspected) exposure to covid-19: Secondary | ICD-10-CM

## 2018-07-31 LAB — NOVEL CORONAVIRUS, NAA: SARS-CoV-2, NAA: NOT DETECTED

## 2018-08-03 ENCOUNTER — Telehealth: Payer: Self-pay | Admitting: General Practice

## 2018-08-03 NOTE — Telephone Encounter (Signed)
Pt made aware of negative results, expressed understanding

## 2018-08-03 NOTE — Telephone Encounter (Signed)
Faxed over to [facility] PCP Fax: [phone]

## 2018-08-20 DIAGNOSIS — Z1231 Encounter for screening mammogram for malignant neoplasm of breast: Secondary | ICD-10-CM | POA: Diagnosis not present

## 2018-09-06 DIAGNOSIS — B009 Herpesviral infection, unspecified: Secondary | ICD-10-CM | POA: Diagnosis not present

## 2018-09-06 DIAGNOSIS — Z0000 Encounter for general adult medical examination without abnormal findings: Secondary | ICD-10-CM | POA: Diagnosis not present

## 2018-09-06 DIAGNOSIS — E7800 Pure hypercholesterolemia, unspecified: Secondary | ICD-10-CM | POA: Diagnosis not present

## 2018-09-06 DIAGNOSIS — I10 Essential (primary) hypertension: Secondary | ICD-10-CM | POA: Diagnosis not present

## 2018-09-06 DIAGNOSIS — F419 Anxiety disorder, unspecified: Secondary | ICD-10-CM | POA: Diagnosis not present

## 2018-09-06 DIAGNOSIS — Z23 Encounter for immunization: Secondary | ICD-10-CM | POA: Diagnosis not present

## 2018-10-01 NOTE — Progress Notes (Signed)
[name] Date of Birth  July 10, 1930 [facility] [address]    [place], [place]  [address] [phone]  Fax  [phone]  Problem list: 1. Coronary artery disease-status post PTCA cutting balloon to the first diagonal 2. Hypertension 3. Hyperlipidemia 4.  peripheral vascular disease-status post right carotid endarterectomy 5. cerebral aneurysm-status post coil embolization- 2008     [name] is a 85 year old female with a history of coronary artery disease. She is  status post PTCA and cutting balloon procedure to her first diagonal artery. She also has a history of hypertension, hyperlipidemia and right carotid endarterectomy.  She denies any episodes of chest pain or shortness of breath.   She has had an occasional episode of lightheadedness.   She has not had any angina.  She complains of cold feet at night.  Jun 01, 2012:  [name] is doing OK - occasional indigestion. Denies any CP or dyspnea.     Several weeks ago she had a presyncopal episode while driving her car.  It ~ 5 seconds and resolved.   Dec. 2, 2014:  [name] is doing well. She's not had any further episodes of syncope or presyncope.  She exercises on occasion.  June 04, 2013:  [name] is upset today.  Her daughter has severe mental retardation and is in a group home and the group home is closing.    She is doing OK from a cardiac standpoint.    She has not had a chance to do much exercise or other activities.    Dec. 15, 2015:  [name] is a 85 yo with hx of CAD, HTN,  PVD, hyperlipidemia.  No CP or dyspnea.  Sept. 29, 2016:   No CP or dyspnea.  Doing well overall   April 23, 2015:  Feels well  She was having some chest pain earlier this year. A stress Myoview study revealed no evidence of ischemia. Left ventricular systolic function is normal.  Oct. 18, 2017: No CP , has some DOE Feels "woozy" in the am .  Improves after she gets going .  No angina .   May 04, 2016:  [name] is seen today for  follow up of her CAD, HTN, hyperlipidemia and carotid artery disease.  Doing well from a cardiac standpoint . Arthritis pain in feet. Able to do some exercise   February 28, 2017: [name] is seen back today for follow-up of her coronary artery disease, hypertension, hyperlipidemia, and mild carotid artery disease. Is slowing down.  No CP or dyspnea.      Aug. 26, 2019  [name] is seen today Her Husband,  [name] passed away since I last saw her.  She is very sad but is doing well from a cardiac standpoint .   No CP or dyspnea   Feb. 11, 2020:  No CP,  Has DOE with activity .  Not able to do all that she used to be able to do.  She has not had breakfast yet today.  This may explain her low blood pressure.  Sept. 29, 2020  [name] is seen for follow up of her CAD Doing well for 85 yo No cp,  Not as active ,  Starting to wak some   Current Outpatient Medications on File Prior to Visit  Medication Sig Dispense Refill  . ALPRAZolam (XANAX) 0.5 MG tablet Take 0.25 mg by mouth daily as needed for anxiety.     Marland Kitchen aspirin EC 81 MG tablet Take 81 mg by  mouth daily.     Marland Kitchen atorvastatin (LIPITOR) 40 MG tablet TAKE 1 TABLET EVERY DAY 90 tablet 1  . Calcium-Magnesium-Vitamin D (CALCIUM 1200+D3 PO) Take 1 tablet by mouth 2 (two) times daily. Vitamin D3 1000 units    . clonazePAM (KLONOPIN) 0.5 MG tablet Take 0.5 mg by mouth at bedtime as needed (sleep).     . DULoxetine (CYMBALTA) 30 MG capsule Take 30 mg by mouth daily.    Marland Kitchen FLUZONE HIGH-DOSE 0.5 ML SUSY Inject as directed once.    . hydrochlorothiazide (HYDRODIURIL) 25 MG tablet TAKE 1 TABLET EVERY DAY 90 tablet 3  . lisinopril (PRINIVIL,ZESTRIL) 20 MG tablet TAKE 1 TABLET EVERY DAY 90 tablet 3  . naproxen sodium (ALEVE) 220 MG tablet Take 220 mg by mouth daily as needed (arthritis pain).    . nitroGLYCERIN (NITROSTAT) 0.4 MG SL tablet Place 1 tablet (0.4 mg total) under the tongue every 5 (five) minutes as needed for chest pain (for up to 3 doses). 25  tablet 6  . potassium chloride (MICRO-K) 10 MEQ CR capsule TAKE 1 CAPSULE EVERY DAY 90 capsule 3  . valACYclovir (VALTREX) 1000 MG tablet Take 2,000 mg by mouth once as needed (breakouts).      No current facility-administered medications on file prior to visit.     Allergies  Allergen Reactions  . Bystolic [Nebivolol Hcl] Other (See Comments)    Mouth sores  . Codeine Other (See Comments)    Strange feeling per patient Strange feeling per patient    Past Medical History:  Diagnosis Date  . Coronary artery disease   . Hyperlipemia   . Hypertension   . S/P carotid endarterectomy    RIGHT CAROTID    Past Surgical History:  Procedure Laterality Date  . CAROTID ENDARTERECTOMY    . CORONARY ANGIOPLASTY WITH STENT PLACEMENT     FIRST DIAGONAL    Social History   Tobacco Use  Smoking Status Never Smoker  Smokeless Tobacco Never Used    Social History   Substance and Sexual Activity  Alcohol Use No    Family History  Problem Relation Age of Onset  . Stroke Father        DECEASED  . Stroke Mother        DECEASED  . Hypertension Mother        DECEASED  . Stroke Brother     Reviw of Systems:  Reviewed in the HPI.  All other systems are negative.  Physical Exam: There were no vitals taken for this visit.  GEN:  Well nourished, well developed in no acute distress HEENT: Normal NECK: No JVD; No carotid bruits LYMPHATICS: No lymphadenopathy CARDIAC: RRR , no murmurs, rubs, gallops RESPIRATORY:  Clear to auscultation without rales, wheezing or rhonchi  ABDOMEN: Soft, non-tender, non-distended MUSCULOSKELETAL:  No edema; No deformity  SKIN: Warm and dry NEUROLOGIC:  Alert and oriented x 3   ECG:   Sept.   29, 2020 : NSR with occas PACs and PVCs.  RBBB .  Assessment / Plan:   1. Coronary artery disease-status post PTCA cutting balloon to the first diagonal - She is not having any angina symptoms.  She wondered if she even needed to refill her  nitroglycerin.  At this point she has not needed it for many years so I think she can hold off and fill it later if she needs it.  I will see her again in 1 year. . 2. Hypertension blood pressure is well controlled.  Continue current medications.   3. Hyperlipidemia-  .  Continue current dose of atorvastatin.  Check fasting lipids, liver enzymes, basic metabolic profile today.  4.  peripheral vascular disease-   5. cerebral aneurysm-status post coil embolization- 2008     [name], MD  10/01/2018 8:50 PM    [facility] [place],  [place], [place]  [address] Pager [phone] Phone: [phone]; Fax: [phone]

## 2018-10-02 ENCOUNTER — Encounter: Payer: Self-pay | Admitting: Cardiovascular Disease

## 2018-10-02 ENCOUNTER — Other Ambulatory Visit: Payer: Self-pay

## 2018-10-02 ENCOUNTER — Ambulatory Visit: Payer: Medicare HMO | Admitting: Cardiovascular Disease

## 2018-10-02 VITALS — BP 110/72 | HR 77 | Ht 64.25 in | Wt 138.0 lb

## 2018-10-02 DIAGNOSIS — E782 Mixed hyperlipidemia: Secondary | ICD-10-CM | POA: Diagnosis not present

## 2018-10-02 LAB — BASIC METABOLIC PANEL
BUN/Creatinine Ratio: 31 — ABNORMAL HIGH (ref 12–28)
BUN: 22 mg/dL (ref 8–27)
CO2: 26 mmol/L (ref 20–29)
Calcium: 9.7 mg/dL (ref 8.7–10.3)
Chloride: 98 mmol/L (ref 96–106)
Creatinine, Ser: 0.71 mg/dL (ref 0.57–1.00)
GFR calc Af Amer: 89 mL/min/{1.73_m2} (ref >59)
GFR calc non Af Amer: 77 mL/min/{1.73_m2} (ref >59)
Glucose: 92 mg/dL (ref 65–99)
Potassium: 4.1 mmol/L (ref 3.5–5.2)
Sodium: 138 mmol/L (ref 134–144)

## 2018-10-02 LAB — HEPATIC FUNCTION PANEL
ALT: 12 IU/L (ref 0–32)
AST: 22 IU/L (ref 0–40)
Albumin: 4.1 g/dL (ref 3.6–4.6)
Alkaline Phosphatase: 60 IU/L (ref 39–117)
Bilirubin Total: 0.4 mg/dL (ref 0.0–1.2)
Bilirubin, Direct: 0.16 mg/dL (ref 0.00–0.40)
Total Protein: 6.7 g/dL (ref 6.0–8.5)

## 2018-10-02 LAB — LIPID PANEL
Chol/HDL Ratio: 2 ratio (ref 0.0–4.4)
Cholesterol, Total: 139 mg/dL (ref 100–199)
HDL: 68 mg/dL (ref >39)
LDL Chol Calc (NIH): 59 mg/dL (ref 0–99)
Triglycerides: 54 mg/dL (ref 0–149)
VLDL Cholesterol Cal: 12 mg/dL (ref 5–40)

## 2018-10-02 MED ORDER — NITROGLYCERIN 0.4 MG SL SUBL: 0.4000 mg | SUBLINGUAL_TABLET | SUBLINGUAL | 6 refills | Status: DC | PRN

## 2018-10-02 NOTE — Patient Instructions (Signed)
Medication Instructions:  Your provider recommends that you continue on your current medications as directed. Please refer to the Current Medication list given to you today.    Labwork: TODAY: lipids, liver, BMP  Testing/Procedures: None  Follow-Up: Your provider wants you to follow-up in: 1 year with Dr. Acie Fredrickson. You will receive a reminder letter in the mail two months in advance. If you don't receive a letter, please call our office to schedule the follow-up appointment.    Any Other Special Instructions Will Be Listed Below (If Applicable).     If you need a refill on your cardiac medications before your next appointment, please call your pharmacy.

## 2018-10-06 ENCOUNTER — Other Ambulatory Visit: Payer: Self-pay | Admitting: Cardiovascular Disease

## 2018-10-30 DIAGNOSIS — Z961 Presence of intraocular lens: Secondary | ICD-10-CM | POA: Diagnosis not present

## 2018-10-30 DIAGNOSIS — H52203 Unspecified astigmatism, bilateral: Secondary | ICD-10-CM | POA: Diagnosis not present

## 2018-10-30 DIAGNOSIS — H26492 Other secondary cataract, left eye: Secondary | ICD-10-CM | POA: Diagnosis not present

## 2018-10-30 DIAGNOSIS — H18513 Endothelial corneal dystrophy, bilateral: Secondary | ICD-10-CM | POA: Diagnosis not present

## 2018-11-22 DIAGNOSIS — H26492 Other secondary cataract, left eye: Secondary | ICD-10-CM | POA: Diagnosis not present

## 2018-11-30 ENCOUNTER — Other Ambulatory Visit: Payer: Self-pay | Admitting: Cardiovascular Disease

## 2019-01-04 ENCOUNTER — Other Ambulatory Visit: Payer: Self-pay | Admitting: Cardiovascular Disease

## 2019-01-15 ENCOUNTER — Other Ambulatory Visit (HOSPITAL_COMMUNITY): Payer: Self-pay | Admitting: Interventional Radiology

## 2019-01-15 DIAGNOSIS — I671 Cerebral aneurysm, nonruptured: Secondary | ICD-10-CM

## 2019-01-23 ENCOUNTER — Ambulatory Visit (HOSPITAL_COMMUNITY): Payer: Medicare HMO

## 2019-01-23 ENCOUNTER — Encounter (HOSPITAL_COMMUNITY): Payer: Self-pay

## 2019-01-25 ENCOUNTER — Ambulatory Visit (HOSPITAL_COMMUNITY)
Admission: RE | Admit: 2019-01-25 | Discharge: 2019-01-25 | Disposition: A | Discharge status: Home / Self Care | Payer: Medicare HMO | Source: Ambulatory Visit | Attending: Interventional Radiology | Admitting: Interventional Radiology

## 2019-01-25 ENCOUNTER — Ambulatory Visit (HOSPITAL_COMMUNITY): Payer: Medicare HMO

## 2019-01-25 ENCOUNTER — Other Ambulatory Visit: Payer: Self-pay

## 2019-01-25 DIAGNOSIS — I671 Cerebral aneurysm, nonruptured: Secondary | ICD-10-CM

## 2019-01-25 DIAGNOSIS — I6521 Occlusion and stenosis of right carotid artery: Secondary | ICD-10-CM | POA: Diagnosis not present

## 2019-01-25 LAB — POCT I-STAT CREATININE: Creatinine, Ser: 0.7 mg/dL (ref 0.44–1.00)

## 2019-01-25 MED ORDER — IOHEXOL 350 MG/ML SOLN
100.0000 mL | Freq: Once | INTRAVENOUS | Status: AC | PRN
  Administered 2019-01-25: 17:00:00 100 mL via INTRAVENOUS

## 2019-01-29 ENCOUNTER — Telehealth (HOSPITAL_COMMUNITY): Payer: Self-pay

## 2019-01-29 NOTE — Telephone Encounter (Signed)
Pt agreed to f/u in 3 years with cta head/neck. AW

## 2019-01-30 ENCOUNTER — Ambulatory Visit: Payer: Medicare HMO

## 2019-02-01 ENCOUNTER — Other Ambulatory Visit: Payer: Self-pay | Admitting: Cardiovascular Disease

## 2019-02-08 ENCOUNTER — Ambulatory Visit: Payer: Medicare HMO | Attending: Internal Medicine

## 2019-02-08 DIAGNOSIS — Z23 Encounter for immunization: Secondary | ICD-10-CM | POA: Insufficient documentation

## 2019-02-08 NOTE — Progress Notes (Signed)
° °  Covid-19 Vaccination Clinic  Name:  [name]    MRN: [id number] DOB: January 08, 1930  02/08/2019  [name] was observed post Covid-19 immunization for 15 minutes without incidence. She was provided with Vaccine Information Sheet and instruction to access the V-Safe system.   [name] was instructed to call 911 with any severe reactions post vaccine:  Difficulty breathing   Swelling of your face and throat   A fast heartbeat   A bad rash all over your body   Dizziness and weakness    Immunizations Administered    Name Date Dose VIS Date Route   Pfizer COVID-19 Vaccine 02/08/2019  8:46 AM 0.3 mL 12/14/2018 Intramuscular   Manufacturer: [place]   Lot: [id number]   [place]: [id number]

## 2019-03-06 ENCOUNTER — Ambulatory Visit: Payer: Medicare HMO | Attending: Internal Medicine

## 2019-03-06 DIAGNOSIS — Z23 Encounter for immunization: Secondary | ICD-10-CM

## 2019-03-06 NOTE — Progress Notes (Signed)
   Covid-19 Vaccination Clinic  Name:  [name]    MRN: [id number] DOB: 12-04-1930  03/06/2019  [name] was observed post Covid-19 immunization for 15 minutes without incident. She was provided with Vaccine Information Sheet and instruction to access the V-Safe system.   [name] was instructed to call 911 with any severe reactions post vaccine: Marland Kitchen Difficulty breathing  . Swelling of face and throat  . A fast heartbeat  . A bad rash all over body  . Dizziness and weakness   Immunizations Administered    Name Date Dose VIS Date Route   Pfizer COVID-19 Vaccine 03/06/2019  8:14 AM 0.3 mL 12/14/2018 Intramuscular   Manufacturer: [place]   Lot: [id number]   [place]: [id number]

## 2019-04-23 IMAGING — CT CT ANGIO HEAD
5 of 17 series · 13 of 47 positions shown · IV contrast (APPLIED)
Comparison: CTA head 01/15/2015

CLINICAL DATA: Follow-up of aneurysm

EXAM:
CT ANGIOGRAPHY HEAD
TECHNIQUE: Multidetector CT imaging of the head was performed using the
standard protocol during bolus administration of intravenous
contrast. Multiplanar CT image reconstructions and MIPs were
obtained to evaluate the vascular anatomy.
CONTRAST:  50mL [id number] IOPAMIDOL ([id number]) INJECTION 76%

[Series 5: headangio 2.0 hr36 3 · axial · 0.41mm/px · z∈[-117,-57]mm · 2 of 92 slices shown]
[im 31/92  brain]
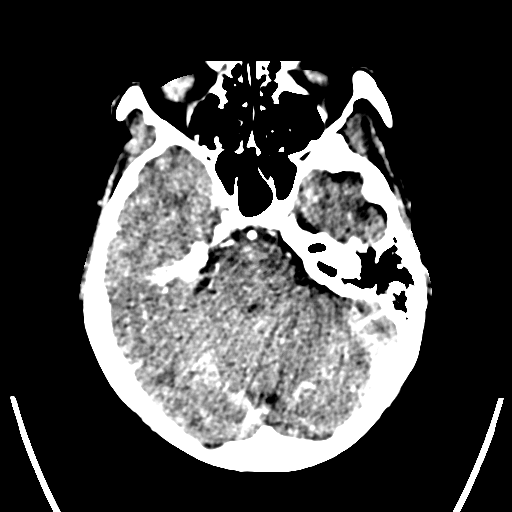
[im 61/92  bone]
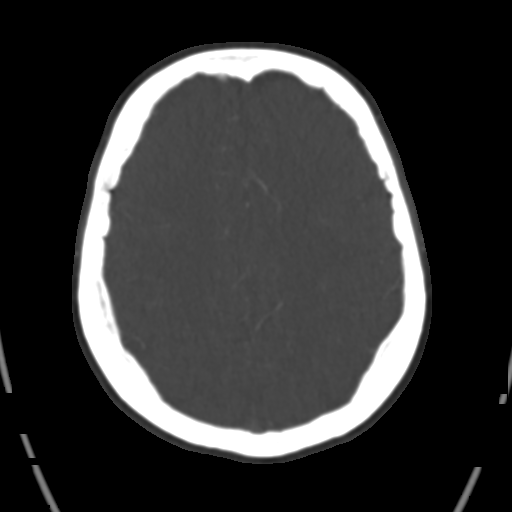

[Series 7: headangio 1.0 mpr cor · coronal · 0.37mm/px · 3 of 210 slices shown]
[im 53/210  brain]
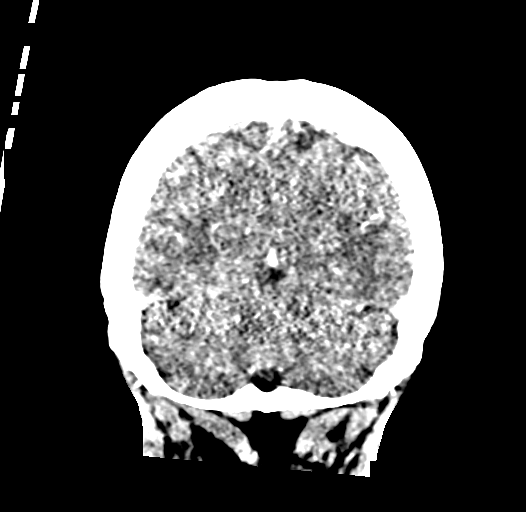
[im 105/210  brain]
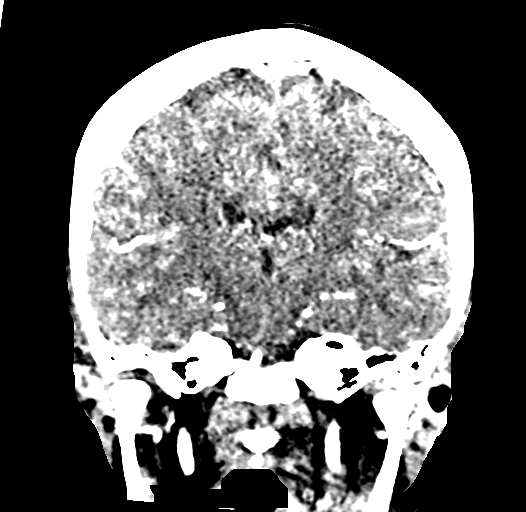
[im 157/210  brain]
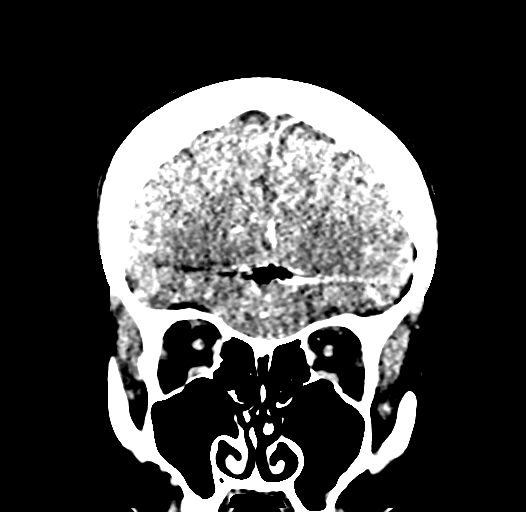

[Series 8: headangio 1.0 mpr sag · sagittal · 0.38mm/px · 2 of 200 slices shown]
[im 67/200  brain]
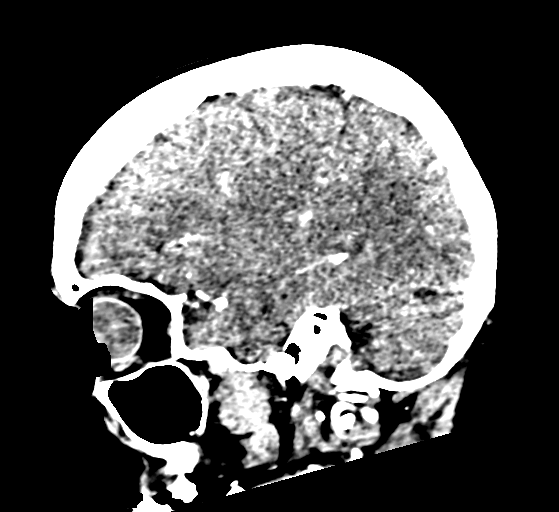
[im 133/200  brain]
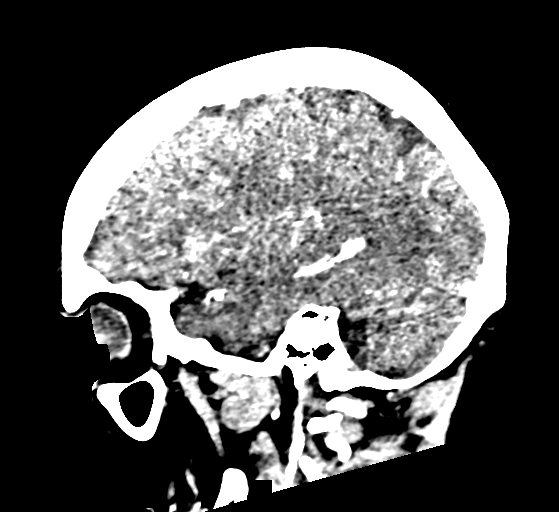

[Series 14: head bone · axial · 0.40mm/px · z∈[-124,-38]mm · 3 of 87 slices shown (1 of 2)]
[im 22/87  bone]
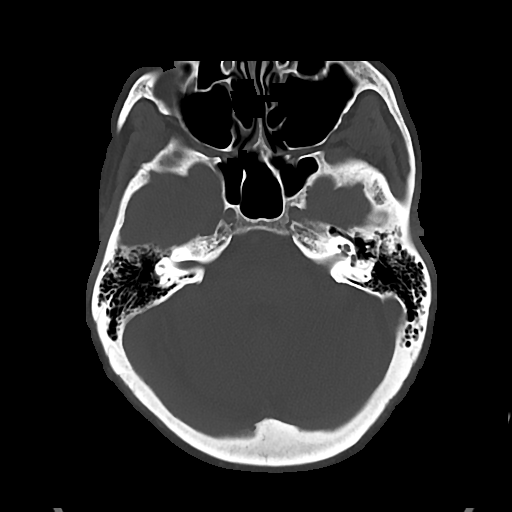
[im 44/87  bone]
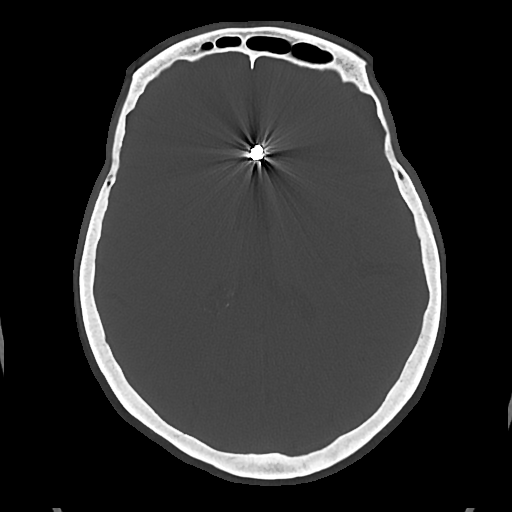
[im 65/87  bone]
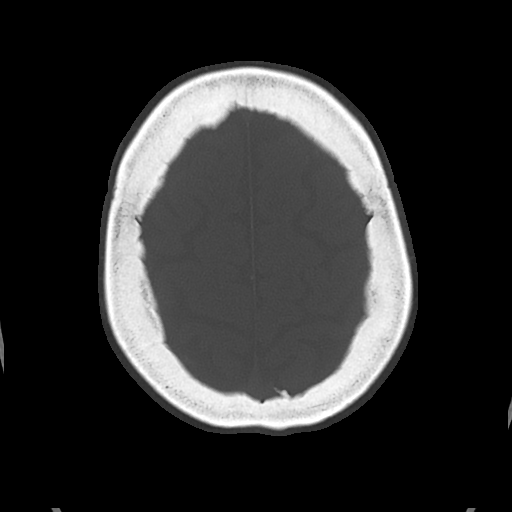

[Series 18: head bone · axial · 0.41mm/px · z∈[-124,-38]mm · 3 of 87 slices shown (2 of 2)]
[im 22/87  bone]
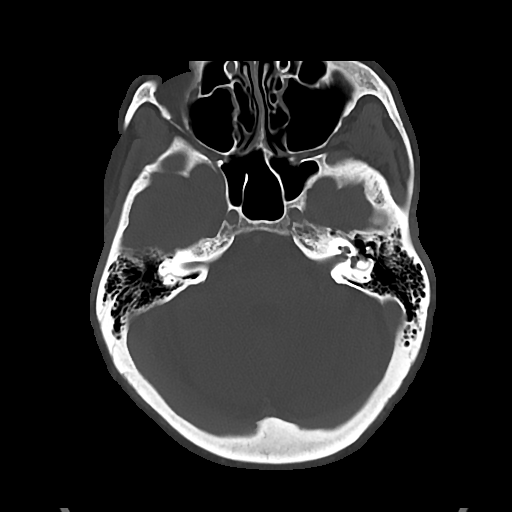
[im 44/87  bone]
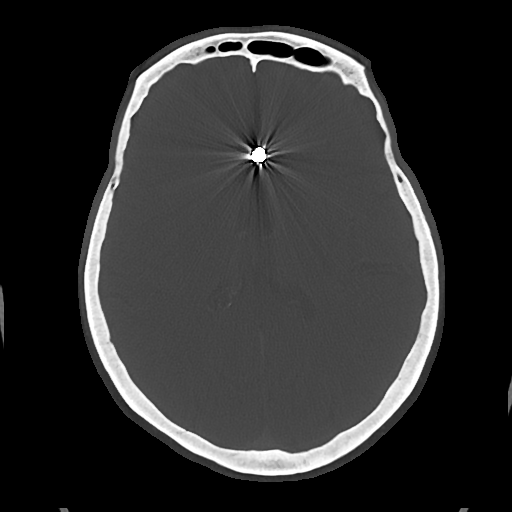
[im 65/87  bone]
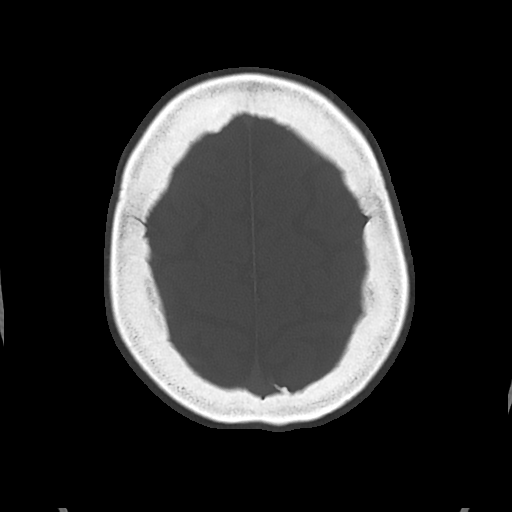

[13 of 47 positions shown; findings below may reference images not displayed]

FINDINGS: CT head

Brain: No mass lesion, intraparenchymal hemorrhage or extra-axial
collection. No evidence of acute cortical infarct. Brain parenchyma
and CSF-containing spaces are normal for age.

Skull: Normal visualized skull base, calvarium and extracranial soft
tissues.

Sinuses/Orbits: No sinus fluid levels or advanced mucosal
thickening. No mastoid effusion. Normal orbits.

CTA HEAD

Anterior circulation:

--Intracranial internal carotid arteries: Normal.

--Anterior cerebral arteries: Coil mass at the site of treated left
pericallosal aneurysm is unchanged. There is no visible aneurysm
filling.

--Middle cerebral arteries: Normal.

--Posterior communicating arteries: Absent bilaterally.

Posterior circulation:

--Posterior cerebral arteries: Normal.

--Superior cerebellar arteries: Normal.

--Basilar artery: Normal.

--Anterior inferior cerebellar arteries: Normal.

--Posterior inferior cerebellar arteries: Normal.

Venous sinuses: As permitted by contrast timing, patent.

Anatomic variants: None

Delayed phase: No abnormal intracranial enhancement.
IMPRESSION: Unchanged appearance of left pericallosal aneurysm coiling without
residual filling.

## 2019-06-05 DIAGNOSIS — H52203 Unspecified astigmatism, bilateral: Secondary | ICD-10-CM | POA: Diagnosis not present

## 2019-06-05 DIAGNOSIS — H47021 Hemorrhage in optic nerve sheath, right eye: Secondary | ICD-10-CM | POA: Diagnosis not present

## 2019-06-05 DIAGNOSIS — H18513 Endothelial corneal dystrophy, bilateral: Secondary | ICD-10-CM | POA: Diagnosis not present

## 2019-06-05 DIAGNOSIS — H40013 Open angle with borderline findings, low risk, bilateral: Secondary | ICD-10-CM | POA: Diagnosis not present

## 2019-07-17 DIAGNOSIS — F419 Anxiety disorder, unspecified: Secondary | ICD-10-CM | POA: Diagnosis not present

## 2019-07-17 DIAGNOSIS — L723 Sebaceous cyst: Secondary | ICD-10-CM | POA: Diagnosis not present

## 2019-08-26 DIAGNOSIS — Z1231 Encounter for screening mammogram for malignant neoplasm of breast: Secondary | ICD-10-CM | POA: Diagnosis not present

## 2019-09-16 DIAGNOSIS — B009 Herpesviral infection, unspecified: Secondary | ICD-10-CM | POA: Diagnosis not present

## 2019-09-16 DIAGNOSIS — I10 Essential (primary) hypertension: Secondary | ICD-10-CM | POA: Diagnosis not present

## 2019-09-16 DIAGNOSIS — F419 Anxiety disorder, unspecified: Secondary | ICD-10-CM | POA: Diagnosis not present

## 2019-09-16 DIAGNOSIS — Z23 Encounter for immunization: Secondary | ICD-10-CM | POA: Diagnosis not present

## 2019-09-16 DIAGNOSIS — E7800 Pure hypercholesterolemia, unspecified: Secondary | ICD-10-CM | POA: Diagnosis not present

## 2019-09-16 DIAGNOSIS — Z0000 Encounter for general adult medical examination without abnormal findings: Secondary | ICD-10-CM | POA: Diagnosis not present

## 2019-09-16 DIAGNOSIS — M810 Age-related osteoporosis without current pathological fracture: Secondary | ICD-10-CM | POA: Diagnosis not present

## 2019-10-06 ENCOUNTER — Encounter: Payer: Self-pay | Admitting: Cardiovascular Disease

## 2019-10-06 NOTE — Progress Notes (Signed)
[name] Date of Birth  [birth year] [facility] [address]    [place], [place]  [address] [phone]  Fax  [phone]  Problem list: 1. Coronary artery disease-status post PTCA cutting balloon to the first diagonal 2. Hypertension 3. Hyperlipidemia 4.  peripheral vascular disease-status post right carotid endarterectomy 5. cerebral aneurysm-status post coil embolization- 2008     [name] is a 86 year old female with a history of coronary artery disease. She is  status post PTCA and cutting balloon procedure to her first diagonal artery. She also has a history of hypertension, hyperlipidemia and right carotid endarterectomy.  She denies any episodes of chest pain or shortness of breath.   She has had an occasional episode of lightheadedness.   She has not had any angina.  She complains of cold feet at night.  Jun 01, 2012:  [name] is doing OK - occasional indigestion. Denies any CP or dyspnea.     Several weeks ago she had a presyncopal episode while driving her car.  It ~ 5 seconds and resolved.   Dec. 2, 2014:  [name] is doing well. She's not had any further episodes of syncope or presyncope.  She exercises on occasion.  June 04, 2013:  [name] is upset today.  Her daughter has severe mental retardation and is in a group home and the group home is closing.    She is doing OK from a cardiac standpoint.    She has not had a chance to do much exercise or other activities.    Dec. 15, 2015:  [name] is a 86 yo with hx of CAD, HTN,  PVD, hyperlipidemia.  No CP or dyspnea.  Sept. 29, 2016:   No CP or dyspnea.  Doing well overall   April 23, 2015:  Feels well  She was having some chest pain earlier this year. A stress Myoview study revealed no evidence of ischemia. Left ventricular systolic function is normal.  Oct. 18, 2017: No CP , has some DOE Feels "woozy" in the am .  Improves after she gets going .  No angina .   May 04, 2016:  [name] is seen today for  follow up of her CAD, HTN, hyperlipidemia and carotid artery disease.  Doing well from a cardiac standpoint . Arthritis pain in feet. Able to do some exercise   February 28, 2017: [name] is seen back today for follow-up of her coronary artery disease, hypertension, hyperlipidemia, and mild carotid artery disease. Is slowing down.  No CP or dyspnea.      Aug. 26, 2019  [name] is seen today Her Husband,  [name] passed away since I last saw her.  She is very sad but is doing well from a cardiac standpoint .   No CP or dyspnea   Feb. 11, 2020:  No CP,  Has DOE with activity .  Not able to do all that she used to be able to do.  She has not had breakfast yet today.  This may explain her low blood pressure.  Sept. 29, 2020  [name] is seen for follow up of her CAD Doing well for 86 yo No cp,  Not as active ,  Starting to wak some  Oct. 4, 2021: [name] is seen for follow up of her CAD and HLD  She is now 86 yo BP is a bit elevated today  Has been ok at her other doctor visits No cp or dyspnea Gets tired when she walks .  Labs from her primary medical doctor reveals relatively normal basic metabolic profile.  Her sodium is 135.  Potassium is 3.8.  Glucose is 104.  Liver enzymes are normal.  Total cholesterol is 122.  Triglyceride level is 50.  HDL is 67.  LDL is 44.  Vitamin D level is 73.   Current Outpatient Medications on File Prior to Visit  Medication Sig Dispense Refill  . ALPRAZolam (XANAX) 0.5 MG tablet Take 0.25 mg by mouth daily as needed for anxiety.     Marland Kitchen aspirin EC 81 MG tablet Take 81 mg by mouth daily.     Marland Kitchen atorvastatin (LIPITOR) 40 MG tablet TAKE 1 TABLET EVERY DAY 90 tablet 3  . Calcium-Magnesium-Vitamin D (CALCIUM 1200+D3 PO) Take 1 tablet by mouth 2 (two) times daily. Vitamin D3 1000 units    . clonazePAM (KLONOPIN) 0.5 MG tablet Take 0.5 mg by mouth at bedtime as needed (sleep).     . DULoxetine (CYMBALTA) 30 MG capsule Take 30 mg by mouth daily.    Marland Kitchen FLUZONE  HIGH-DOSE 0.5 ML SUSY Inject as directed once.    . hydrochlorothiazide (HYDRODIURIL) 25 MG tablet Take 1 tablet (25 mg total) by mouth daily. 90 tablet 3  . lisinopril (ZESTRIL) 20 MG tablet TAKE 1 TABLET EVERY DAY 90 tablet 2  . MURO 128 5 % ophthalmic solution Place 1 drop into both eyes in the morning, at noon, in the evening, and at bedtime.    . naproxen sodium (ALEVE) 220 MG tablet Take 220 mg by mouth daily as needed (arthritis pain).    . nitroGLYCERIN (NITROSTAT) 0.4 MG SL tablet Place 1 tablet (0.4 mg total) under the tongue every 5 (five) minutes as needed for chest pain (for up to 3 doses). 25 tablet 6  . potassium chloride (MICRO-K) 10 MEQ CR capsule TAKE 1 CAPSULE EVERY DAY 90 capsule 3  . valACYclovir (VALTREX) 1000 MG tablet Take 2,000 mg by mouth once as needed (breakouts).      No current facility-administered medications on file prior to visit.    Allergies  Allergen Reactions  . Bystolic [Nebivolol Hcl] Other (See Comments)    Mouth sores  . Codeine Other (See Comments)    Strange feeling per patient Strange feeling per patient    Past Medical History:  Diagnosis Date  . Coronary artery disease   . Hyperlipemia   . Hypertension   . S/P carotid endarterectomy    RIGHT CAROTID    Past Surgical History:  Procedure Laterality Date  . CAROTID ENDARTERECTOMY    . CORONARY ANGIOPLASTY WITH STENT PLACEMENT     FIRST DIAGONAL    Social History   Tobacco Use  Smoking Status Never Smoker  Smokeless Tobacco Never Used    Social History   Substance and Sexual Activity  Alcohol Use No    Family History  Problem Relation Age of Onset  . Stroke Father        DECEASED  . Stroke Mother        DECEASED  . Hypertension Mother        DECEASED  . Stroke Brother     Reviw of Systems:  Reviewed in the HPI.  All other systems are negative.  Physical Exam: Blood pressure (!) 154/86, pulse 74, height 5\' 4"  (1.626 m), weight 136 lb 9.6 oz (62 kg), SpO2 96  %.  GEN:  Well nourished, well developed in no acute distress HEENT: Normal NECK: No JVD; No carotid bruits LYMPHATICS:  No lymphadenopathy CARDIAC: RRR , no murmurs, rubs, gallops RESPIRATORY:  Clear to auscultation without rales, wheezing or rhonchi  ABDOMEN: Soft, non-tender, non-distended MUSCULOSKELETAL:  No edema; No deformity  SKIN: Warm and dry NEUROLOGIC:  Alert and oriented x 3   ECG:   NSR at 74.   PACs,  RBBB   Assessment / Plan:   1. Coronary artery disease-status post PTCA cutting balloon to the first diagonal - No angina  Seems to be very stable   . 2. Hypertension:  BP  Is well controlled.   Cont current meds.    3. Hyperlipidemia-   lipids from her primary MD look good   4.  peripheral vascular disease-   5. cerebral aneurysm-status post coil embolization- 2008     [name], MD  10/07/2019 2:35 PM    [address],  [place], [place]  [address] Pager [phone] Phone: [phone]; Fax: [phone]

## 2019-10-07 ENCOUNTER — Other Ambulatory Visit: Payer: Self-pay

## 2019-10-07 ENCOUNTER — Encounter: Payer: Self-pay | Admitting: Cardiovascular Disease

## 2019-10-07 ENCOUNTER — Ambulatory Visit (INDEPENDENT_AMBULATORY_CARE_PROVIDER_SITE_OTHER): Payer: Medicare HMO | Admitting: Cardiovascular Disease

## 2019-10-07 VITALS — BP 134/70 | HR 74 | Ht 64.0 in | Wt 136.6 lb

## 2019-10-07 DIAGNOSIS — I739 Peripheral vascular disease, unspecified: Secondary | ICD-10-CM | POA: Diagnosis not present

## 2019-10-07 DIAGNOSIS — I6523 Occlusion and stenosis of bilateral carotid arteries: Secondary | ICD-10-CM

## 2019-10-07 DIAGNOSIS — E782 Mixed hyperlipidemia: Secondary | ICD-10-CM

## 2019-10-07 DIAGNOSIS — I2510 Atherosclerotic heart disease of native coronary artery without angina pectoris: Secondary | ICD-10-CM | POA: Diagnosis not present

## 2019-10-07 NOTE — Patient Instructions (Addendum)
Medication Instructions:  Your physician recommends that you continue on your current medications as directed. Please refer to the Current Medication list given to you today.  *If you need a refill on your cardiac medications before your next appointment, please call your pharmacy*   Lab Work: None Ordered If you have labs (blood work) drawn today and your tests are completely normal, you will receive your results only by: Marland Kitchen MyChart Message (if you have MyChart) OR . A paper copy in the mail If you have any lab test that is abnormal or we need to change your treatment, we will call you to review the results.    Testing/Procedures: Your physician has requested that you have a carotid duplex in 1 year. This test is an ultrasound of the carotid arteries in your neck. It looks at blood flow through these arteries that supply the brain with blood. Allow one hour for this exam. There are no restrictions or special instructions.     Follow-Up: At [facility], you and your health needs are our priority.  As part of our continuing mission to provide you with exceptional heart care, we have created designated Provider Care Teams.  These Care Teams include your primary Cardiologist (physician) and Advanced Practice Providers (APPs -  Physician Assistants and Nurse Practitioners) who all work together to provide you with the care you need, when you need it.     Your next appointment:   1 year(s)  The format for your next appointment:   In Person  Provider:   You may see [name], MD or one of the following Advanced Practice Providers on your designated Care Team:    Richardson Dopp, PA-C  [place], [place]

## 2019-10-09 DIAGNOSIS — H18513 Endothelial corneal dystrophy, bilateral: Secondary | ICD-10-CM | POA: Diagnosis not present

## 2019-10-09 DIAGNOSIS — H47021 Hemorrhage in optic nerve sheath, right eye: Secondary | ICD-10-CM | POA: Diagnosis not present

## 2019-10-09 DIAGNOSIS — H40011 Open angle with borderline findings, low risk, right eye: Secondary | ICD-10-CM | POA: Diagnosis not present

## 2019-10-09 DIAGNOSIS — H5211 Myopia, right eye: Secondary | ICD-10-CM | POA: Diagnosis not present

## 2019-10-15 DIAGNOSIS — R1909 Other intra-abdominal and pelvic swelling, mass and lump: Secondary | ICD-10-CM | POA: Diagnosis not present

## 2019-10-16 ENCOUNTER — Ambulatory Visit (HOSPITAL_COMMUNITY)
Admission: RE | Admit: 2019-10-16 | Payer: Medicare HMO | Source: Ambulatory Visit | Attending: Cardiovascular Disease | Admitting: Cardiovascular Disease

## 2019-10-16 ENCOUNTER — Other Ambulatory Visit (HOSPITAL_COMMUNITY): Payer: Self-pay | Admitting: Family Medicine

## 2019-10-16 DIAGNOSIS — R1909 Other intra-abdominal and pelvic swelling, mass and lump: Secondary | ICD-10-CM

## 2019-10-17 ENCOUNTER — Encounter (HOSPITAL_COMMUNITY): Payer: Self-pay

## 2019-10-17 NOTE — Progress Notes (Unsigned)
[name] Female, 86 y.o., 12-02-1930 MRN:  [id number] Phone:  [phone] [name]) PCP:  Glenis Smoker, MD Coverage:  [facility] Medicare/[employer] Medicare [place] With Radiology ([facility]-US 2) 10/23/2019 at 8:00 AM  RE: Urgent Aspiraton Received: Today Message Details  Suttle, Rosanne Ashing, MD  Lennox Solders E Approved for ultrasound guided aspiration.   Dylan   Previous Messages  ----- Message -----  From: Lenore Cordia  Sent: 10/16/2019 10:27 AM EDT  To: Ir Procedure Requests  Subject: Urgent Aspiraton                 Procedure Requested: FNA    Reason for Procedure: indetermate 3.1 mm cystic mass in the left inguinal regional correspoinding to area of palpable concern. Has increased in size since CT of 05/01/2017. Deferential considerations include: seroma prior old hematoma, a nectrotic lymph node, or other ideology. Mass would be amenable to fine needle aspiration for further investigation    Provider Requesting:  Dr Sela Hilding  Provider Telephone: [phone]    Other Info

## 2019-10-19 ENCOUNTER — Other Ambulatory Visit: Payer: Self-pay | Admitting: Cardiovascular Disease

## 2019-10-22 ENCOUNTER — Other Ambulatory Visit: Payer: Self-pay | Admitting: Student

## 2019-10-22 NOTE — H&P (Signed)
Chief Complaint: Patient was seen in consultation today for left inguinal region mass biopsy.  Referring Physician(s): Glenis Smoker (PCP)  Supervising Physician: Ruthann Cancer  Patient Status: [facility] - Out-pt  History of Present Illness: [name] is a 86 y.o. female with a past medical history significant for CAD s/p coronary stenting and right carotid endarterectomy, HTN, HLD and cerebral aneurysm s/p coil embolization (2008 in [facility]) who presents today for a left inguinal mass biopsy. [name] was incidentally noted to have a 1.5 cm probable low-density lymph node in the left inguinal region of undetermined significance on CT abd/pelvis dated 05/01/17 which was obtained for RLQ pain. She has been followed by her PCP regularly and was recently noted to have palpable enlargement in the left inguinal region which corresponded to the previously seen abnormality on CT. An US of the area was performed on 10/15/19 which noted an indeterminate 3.1 cm cystic mass in the left inguinal region - differential considerations including seroma, prior old hematoma, necrotic lymph node or other etiology per report. IR has been asked to perform a biopsy of this area for further evaluation.  [name] denies any complaints today, she has been feeling well overall. She only noticed the swelling in her left groin area recently, it is non tender and does not bother her. She is nervous about the biopsy and possible results. She understands the procedure today and is agreeable to proceed as planned.  Past Medical History:  Diagnosis Date  . Coronary artery disease   . Hyperlipemia   . Hypertension   . S/P carotid endarterectomy    RIGHT CAROTID    Past Surgical History:  Procedure Laterality Date  . CAROTID ENDARTERECTOMY    . CORONARY ANGIOPLASTY WITH STENT PLACEMENT     FIRST DIAGONAL    Allergies: Bystolic [nebivolol hcl] and Codeine  Medications: Prior to Admission medications   Medication  Sig Start Date End Date Taking? Authorizing Provider  ALPRAZolam [name]) 0.5 MG tablet Take 0.25 mg by mouth daily as needed for anxiety.     [provider]  aspirin EC 81 MG tablet Take 81 mg by mouth daily.  11/15/11   Nahser, Wonda Cheng, MD  atorvastatin (LIPITOR) 40 MG tablet TAKE 1 TABLET EVERY DAY 12/03/18   Nahser, Wonda Cheng, MD  Calcium-Magnesium-Vitamin D (CALCIUM 1200+D3 PO) Take 1 tablet by mouth 2 (two) times daily. Vitamin D3 1000 units    [provider]  clonazePAM (KLONOPIN) 0.5 MG tablet Take 0.5 mg by mouth at bedtime as needed (sleep).  10/20/14   [provider]  DULoxetine (CYMBALTA) 30 MG capsule Take 30 mg by mouth daily. 03/09/15   [provider]  FLUZONE HIGH-DOSE 0.5 ML SUSY Inject as directed once. 10/02/15   [provider]  hydrochlorothiazide (HYDRODIURIL) 25 MG tablet Take 1 tablet (25 mg total) by mouth daily. 10/21/19   Nahser, Wonda Cheng, MD  lisinopril (ZESTRIL) 20 MG tablet TAKE 1 TABLET EVERY DAY 01/07/19   Nahser, Wonda Cheng, MD  MURO 128 5 % ophthalmic solution Place 1 drop into both eyes in the morning, at noon, in the evening, and at bedtime. 08/28/19   [provider]  naproxen sodium (ALEVE) 220 MG tablet Take 220 mg by mouth daily as needed (arthritis pain).    [provider]  nitroGLYCERIN (NITROSTAT) 0.4 MG SL tablet Place 1 tablet (0.4 mg total) under the tongue every 5 (five) minutes as needed for chest pain (for up to  3 doses). 10/02/18   Nahser, Wonda Cheng, MD  potassium chloride (MICRO-K) 10 MEQ CR capsule TAKE 1 CAPSULE EVERY DAY 02/01/19   Nahser, Wonda Cheng, MD  valACYclovir (VALTREX) 1000 MG tablet Take 2,000 mg by mouth once as needed (breakouts).     [provider]     Family History  Problem Relation Age of Onset  . Stroke Father        DECEASED  . Stroke Mother        DECEASED  . Hypertension Mother        DECEASED  . Stroke Brother     Social History   Socioeconomic  History  . Marital status: Married    Spouse name: Not on file  . Number of children: Not on file  . Years of education: Not on file  . Highest education level: Not on file  Occupational History  . Not on file  Tobacco Use  . Smoking status: Never Smoker  . Smokeless tobacco: Never Used  Vaping Use  . Vaping Use: Never used  Substance and Sexual Activity  . Alcohol use: No  . Drug use: Never  . Sexual activity: Not on file  Other Topics Concern  . Not on file  Social History Narrative  . Not on file   Social Determinants of Health   Financial Resource Strain:   . Difficulty of Paying Living Expenses: Not on file  Food Insecurity:   . Worried About Charity fundraiser in the Last Year: Not on file  . Ran Out of Food in the Last Year: Not on file  Transportation Needs:   . Lack of Transportation (Medical): Not on file  . Lack of Transportation (Non-Medical): Not on file  Physical Activity:   . Days of Exercise per Week: Not on file  . Minutes of Exercise per Session: Not on file  Stress:   . Feeling of Stress : Not on file  Social Connections:   . Frequency of Communication with Friends and Family: Not on file  . Frequency of Social Gatherings with Friends and Family: Not on file  . Attends Religious Services: Not on file  . Active Member of Clubs or Organizations: Not on file  . Attends Archivist Meetings: Not on file  . Marital Status: Not on file     Review of Systems: A 12 point ROS discussed and pertinent positives are indicated in the HPI above.  All other systems are negative.  Review of Systems  Constitutional: Negative for chills and fever.  Respiratory: Negative for cough and shortness of breath.   Cardiovascular: Negative for chest pain.  Gastrointestinal: Negative for abdominal pain, diarrhea, nausea and vomiting.  Musculoskeletal: Negative for back pain.  Neurological: Negative for dizziness and headaches.    Vital Signs: BP 127/61    Pulse 66   Temp 97.8 F (36.6 C) (Oral)   Resp 14   Ht 5\' 4"  (1.626 m)   Wt 135 lb (61.2 kg)   SpO2 99%   BMI 23.17 kg/m   Physical Exam Vitals reviewed.  Constitutional:      General: She is not in acute distress. HENT:     Head: Normocephalic.     Mouth/Throat:     Mouth: Mucous membranes are moist.     Pharynx: Oropharynx is clear. No oropharyngeal exudate or posterior oropharyngeal erythema.  Cardiovascular:     Rate and Rhythm: Normal rate and regular rhythm.  Pulmonary:  Effort: Pulmonary effort is normal.     Breath sounds: Normal breath sounds.  Abdominal:     General: There is no distension.     Palpations: Abdomen is soft.     Tenderness: There is no abdominal tenderness.  Skin:    General: Skin is warm and dry.  Neurological:     Mental Status: She is alert and oriented to person, place, and time.  Psychiatric:        Mood and Affect: Mood normal.        Behavior: Behavior normal.        Thought Content: Thought content normal.        Judgment: Judgment normal.      MD Evaluation Airway: WNL Heart: WNL Abdomen: WNL Chest/ Lungs: WNL ASA  Classification: 3 Mallampati/Airway Score: One   Imaging: No results found.  Labs:  CBC: No results for input(s): WBC, HGB, HCT, PLT in the last 8760 hours.  COAGS: No results for input(s): INR, APTT in the last 8760 hours.  BMP: Recent Labs    01/25/19 1603  CREATININE 0.70    LIVER FUNCTION TESTS: No results for input(s): BILITOT, AST, ALT, ALKPHOS, PROT, ALBUMIN in the last 8760 hours.  TUMOR MARKERS: No results for input(s): AFPTM, CEA, CA199, CHROMGRNA in the last 8760 hours.  Assessment and Plan:  86 y/o F with enlarging left inguinal region mass of unknown etiology who presents today for an image guided fine needle aspiration to further direct care.  Patient has been NPO since 8 pm last night, no current anticoagulation/antiplatelet medications except for ASA 81 mg. Afebrile.  Risks  and benefits of left inguinal mass biopsy was discussed with the patient and/or patient's family including, but not limited to bleeding, infection, damage to adjacent structures or low yield requiring additional tests.  All of the questions were answered and there is agreement to proceed.  Consent signed and in chart.  Thank you for this interesting consult.  I greatly enjoyed meeting [name] and look forward to participating in their care.  A copy of this report was sent to the requesting provider on this date.  Electronically Signed: Joaquim Nam, PA-C 10/22/2019, 3:37 PM   I spent a total of 30 Minutes n face to face in clinical consultation, greater than 50% of which was counseling/coordinating care for left inguinal mass biopsy.

## 2019-10-23 ENCOUNTER — Ambulatory Visit (HOSPITAL_COMMUNITY)
Admission: RE | Admit: 2019-10-23 | Discharge: 2019-10-23 | Disposition: A | Discharge status: Home / Self Care | Payer: Medicare HMO | Source: Ambulatory Visit | Attending: Family Medicine | Admitting: Family Medicine

## 2019-10-23 ENCOUNTER — Other Ambulatory Visit (HOSPITAL_COMMUNITY): Payer: Self-pay | Admitting: Family Medicine

## 2019-10-23 ENCOUNTER — Other Ambulatory Visit: Payer: Self-pay

## 2019-10-23 DIAGNOSIS — R1909 Other intra-abdominal and pelvic swelling, mass and lump: Secondary | ICD-10-CM

## 2019-10-23 DIAGNOSIS — L02214 Cutaneous abscess of groin: Secondary | ICD-10-CM | POA: Diagnosis not present

## 2019-10-23 MED ORDER — MIDAZOLAM HCL 2 MG/2ML IJ SOLN
INTRAMUSCULAR | Status: AC | PRN
  Administered 2019-10-23: 1 mg via INTRAVENOUS

## 2019-10-23 MED ORDER — FENTANYL CITRATE (PF) 100 MCG/2ML IJ SOLN
INTRAMUSCULAR | Status: AC
  Filled 2019-10-23: qty 2

## 2019-10-23 MED ORDER — MIDAZOLAM HCL 2 MG/2ML IJ SOLN
INTRAMUSCULAR | Status: AC
  Filled 2019-10-23: qty 2

## 2019-10-23 MED ORDER — FENTANYL CITRATE (PF) 100 MCG/2ML IJ SOLN
INTRAMUSCULAR | Status: AC | PRN
  Administered 2019-10-23: 25 ug via INTRAVENOUS

## 2019-10-23 MED ORDER — LIDOCAINE HCL (PF) 1 % IJ SOLN
INTRAMUSCULAR | Status: AC
  Filled 2019-10-23: qty 30

## 2019-10-23 MED ORDER — SODIUM CHLORIDE 0.9 % IV SOLN: INTRAVENOUS | Status: DC

## 2019-10-23 NOTE — Procedures (Signed)
Interventional Radiology Procedure Note  Procedure: Ultrasound guided left inguinal cyst aspiration  Findings: Please refer to procedural dictation for full description. 8 mL serous fluid aspirated from simple cyst in left groin.  Sample sent for cytology.  Complications: None immeidate  Estimated Blood Loss: <1 mL  Recommendations: Follow up cytology results.   Ruthann Cancer, MD Pager: [phone]

## 2019-10-23 NOTE — Discharge Instructions (Signed)
Moderate Conscious Sedation, Adult, Care After These instructions provide you with information about caring for yourself after your procedure. Your health care provider may also give you more specific instructions. Your treatment has been planned according to current medical practices, but problems sometimes occur. Call your health care provider if you have any problems or questions after your procedure. What can I expect after the procedure? After your procedure, it is common:  To feel sleepy for several hours.  To feel clumsy and have poor balance for several hours.  To have poor judgment for several hours.  To vomit if you eat too soon. Follow these instructions at home: For at least 24 hours after the procedure:   Do not: ? Participate in activities where you could fall or become injured. ? Drive. ? Use heavy machinery. ? Drink alcohol. ? Take sleeping pills or medicines that cause drowsiness. ? Make important decisions or sign legal documents. ? Take care of children on your own.  Rest. Eating and drinking  Follow the diet recommended by your health care provider.  If you vomit: ? Drink water, juice, or soup when you can drink without vomiting. ? Make sure you have little or no nausea before eating solid foods. General instructions  Have a responsible adult stay with you until you are awake and alert.  Take over-the-counter and prescription medicines only as told by your health care provider.  If you smoke, do not smoke without supervision.  Keep all follow-up visits as told by your health care provider. This is important. Contact a health care provider if:  You keep feeling nauseous or you keep vomiting.  You feel light-headed.  You develop a rash.  You have a fever. Get help right away if:  You have trouble breathing. This information is not intended to replace advice given to you by your health care provider. Make sure you discuss any questions you have  with your health care provider. Document Revised: 12/02/2016 Document Reviewed: 04/11/2015 Elsevier Patient Education  [place] Lymph Node Biopsy, Care After This sheet gives you information about how to care for yourself after your procedure. Your health care provider may also give you more specific instructions. If you have problems or questions, contact your health care provider. What can I expect after the procedure? After the procedure, it is common to have:  Bruising.  Soreness.  Mild swelling. Follow these instructions at home: Medicines  Take over-the-counter and prescription medicines only as told by your health care provider.  If you were prescribed an antibiotic medicine, take it as told by your health care provider. Do not stop taking the antibiotic even if you start to feel better. Incision care   Follow instructions from your health care provider about how to take care of your incision. Make sure you: ? Wash your hands with soap and water before and after you change your bandage (dressing). If soap and water are not available, use hand sanitizer. ? Change your dressing as told by your health care provider. ? Leave stitches (sutures), skin glue, or adhesive strips in place. These skin closures may need to stay in place for 2 weeks or longer. If adhesive strip edges start to loosen and curl up, you may trim the loose edges. Do not remove adhesive strips completely unless your health care provider tells you to do that.  Check your incision area every day for signs of infection. Check for: ? More redness, swelling, or pain. ? Fluid or  blood. ? Warmth. ? Pus or a bad smell. Driving  Do not drive for 24 hours if you were given a sedative during your procedure.  Do not drive or use heavy machinery while taking prescription pain medicine. General instructions  Return to your normal activities as told by your health care provider. Ask your health care  provider what activities are safe for you.  Do not take baths, swim, or use a hot tub until your health care provider approves. Ask your health care provider if you may take showers. You may only be allowed to take sponge baths.  Keep all follow-up visits as told by your health care provider. This is important. Contact a health care provider if:  You have more redness, swelling, or pain around your incision.  You have fluid or blood coming from your incision.  Your incision feels warm to the touch.  You have pus or a bad smell coming from your incision.  You have a fever.  You have pain or numbness that gets worse or lasts longer than a few days. Summary  After a lymph node biopsy, it is common to have bruising, soreness, and mild swelling.  Follow your health care provider's instructions about taking care of yourself at home. You will be told how to take medicines, take care of your incision, and check for infection.  Return to your normal activities as told by your health care provider. Ask your health care provider what activities are safe for you.  Contact a health care provider if you have more redness, swelling, or pain around your incision, you have a fever, or you have worsening pain or numbness. This information is not intended to replace advice given to you by your health care provider. Make sure you discuss any questions you have with your health care provider. Document Revised: 07/27/2017 Document Reviewed: 07/27/2017 Elsevier Patient Education  [place].

## 2019-10-25 ENCOUNTER — Other Ambulatory Visit (HOSPITAL_COMMUNITY): Payer: Self-pay | Admitting: Cardiovascular Disease

## 2019-10-25 ENCOUNTER — Ambulatory Visit (HOSPITAL_COMMUNITY)
Admission: RE | Admit: 2019-10-25 | Discharge: 2019-10-25 | Disposition: A | Discharge status: Home / Self Care | Payer: Medicare HMO | Source: Ambulatory Visit | Attending: Cardiology | Admitting: Cardiology

## 2019-10-25 ENCOUNTER — Other Ambulatory Visit: Payer: Self-pay

## 2019-10-25 DIAGNOSIS — Z9889 Other specified postprocedural states: Secondary | ICD-10-CM

## 2019-10-25 DIAGNOSIS — E782 Mixed hyperlipidemia: Secondary | ICD-10-CM

## 2019-10-25 DIAGNOSIS — I2510 Atherosclerotic heart disease of native coronary artery without angina pectoris: Secondary | ICD-10-CM

## 2019-10-25 DIAGNOSIS — I6523 Occlusion and stenosis of bilateral carotid arteries: Secondary | ICD-10-CM

## 2019-10-25 LAB — CYTOLOGY - NON PAP

## 2019-11-04 DIAGNOSIS — M179 Osteoarthritis of knee, unspecified: Secondary | ICD-10-CM | POA: Diagnosis not present

## 2019-11-07 ENCOUNTER — Other Ambulatory Visit: Payer: Self-pay | Admitting: Cardiovascular Disease

## 2019-11-12 ENCOUNTER — Telehealth: Payer: Self-pay | Admitting: Cardiovascular Disease

## 2019-11-12 NOTE — Telephone Encounter (Signed)
Pt called in and stated she seen her PCP about a week ago and per her blood work, the PCP took her off of the Potassium and the Hydrochlorothiazide and put her on 5mg  of Amlodipine.  She would like to know if dr Liana Crocker agrees and she be taking this along with her other meds.  She is a little confused   Best number [phone]

## 2019-11-12 NOTE — Telephone Encounter (Signed)
Spoke with pt and made her aware of information from Dr. Acie Fredrickson.  Pt appreciative for call.

## 2019-11-12 NOTE — Telephone Encounter (Addendum)
Pt called to report that her PCP Dr. Lindell Noe had seen her in office recently and her BP was over 150 so she made some med changes and she wanted to be sure that Dr. Acie Fredrickson was okay with it..   She had D/C'd her HCTZ and K  She added Amlodipine 5 mg which the pt just started yesterday.   I advised the pt to monitor her BP and to report some of her readings to Dr. Lindell Noe to be sure that she is okay with the dose she started her on.   Pt agreed and still would like Dr. Acie Fredrickson to take a look the message. Will forward.    *Med changes made on her med list*

## 2019-11-12 NOTE — Telephone Encounter (Signed)
Its ok with me as long as BP is well controlled and she does not have any symptoms from the amlodipine ( ankle swelling , headache )

## 2019-11-18 DIAGNOSIS — I10 Essential (primary) hypertension: Secondary | ICD-10-CM | POA: Diagnosis not present

## 2019-11-18 DIAGNOSIS — M25569 Pain in unspecified knee: Secondary | ICD-10-CM | POA: Diagnosis not present

## 2019-11-21 DIAGNOSIS — M25561 Pain in right knee: Secondary | ICD-10-CM | POA: Diagnosis not present

## 2019-12-02 ENCOUNTER — Other Ambulatory Visit: Payer: Self-pay | Admitting: Cardiovascular Disease

## 2019-12-19 DIAGNOSIS — M1711 Unilateral primary osteoarthritis, right knee: Secondary | ICD-10-CM | POA: Diagnosis not present

## 2020-01-08 DIAGNOSIS — U071 COVID-19: Secondary | ICD-10-CM | POA: Diagnosis not present

## 2020-01-17 DIAGNOSIS — Z03818 Encounter for observation for suspected exposure to other biological agents ruled out: Secondary | ICD-10-CM | POA: Diagnosis not present

## 2020-06-17 DIAGNOSIS — H40023 Open angle with borderline findings, high risk, bilateral: Secondary | ICD-10-CM | POA: Diagnosis not present

## 2020-06-17 DIAGNOSIS — H52203 Unspecified astigmatism, bilateral: Secondary | ICD-10-CM | POA: Diagnosis not present

## 2020-06-17 DIAGNOSIS — H524 Presbyopia: Secondary | ICD-10-CM | POA: Diagnosis not present

## 2020-06-17 DIAGNOSIS — H18513 Endothelial corneal dystrophy, bilateral: Secondary | ICD-10-CM | POA: Diagnosis not present

## 2020-07-27 DIAGNOSIS — Z0100 Encounter for examination of eyes and vision without abnormal findings: Secondary | ICD-10-CM | POA: Diagnosis not present

## 2020-08-31 DIAGNOSIS — Z1231 Encounter for screening mammogram for malignant neoplasm of breast: Secondary | ICD-10-CM | POA: Diagnosis not present

## 2020-09-07 ENCOUNTER — Encounter: Payer: Self-pay | Admitting: Cardiovascular Disease

## 2020-09-07 NOTE — Progress Notes (Signed)
[name] Date of Birth  [birth year] [facility] [address]    [place], [place]  [address] [phone]  Fax  [phone]  Problem list: 1. Coronary artery disease-status post PTCA cutting balloon to the first diagonal 2. Hypertension 3. Hyperlipidemia 4.  peripheral vascular disease-status post right carotid endarterectomy 5. cerebral aneurysm-status post coil embolization- 2008     Emmalyne is a 87 year old female with a history of coronary artery disease. She is  status post PTCA and cutting balloon procedure to her first diagonal artery. She also has a history of hypertension, hyperlipidemia and right carotid endarterectomy.  She denies any episodes of chest pain or shortness of breath.   She has had an occasional episode of lightheadedness.   She has not had any angina.  She complains of cold feet at night.  Jun 01, 2012:  Keirston is doing OK - occasional indigestion. Denies any CP or dyspnea.     Several weeks ago she had a presyncopal episode while driving her car.  It ~ 5 seconds and resolved.   Dec. 2, 2014:  Tiann is doing well. She's not had any further episodes of syncope or presyncope.  She exercises on occasion.  June 04, 2013:  Darenda is upset today.  Her daughter has severe mental retardation and is in a group home and the group home is closing.    She is doing OK from a cardiac standpoint.    She has not had a chance to do much exercise or other activities.    Dec. 15, 2015:  Odalys is a 87 yo with hx of CAD, HTN,  PVD, hyperlipidemia.  No CP or dyspnea.  Sept. 29, 2016:   No CP or dyspnea.  Doing well overall   April 23, 2015:  Feels well  She was having some chest pain earlier this year. A stress Myoview study revealed no evidence of ischemia. Left ventricular systolic function is normal.  Oct. 18, 2017: No CP , has some DOE Feels "woozy" in the am .  Improves after she gets going .  No angina .   May 04, 2016:  Breanne is seen today for  follow up of her CAD, HTN, hyperlipidemia and carotid artery disease.  Doing well from a cardiac standpoint . Arthritis pain in feet. Able to do some exercise   February 28, 2017: Mischell is seen back today for follow-up of her coronary artery disease, hypertension, hyperlipidemia, and mild carotid artery disease. Is slowing down.  No CP or dyspnea.      Aug. 26, 2019  Katherinne is seen today Her Husband,  [name] passed away since I last saw her.  She is very sad but is doing well from a cardiac standpoint .   No CP or dyspnea   Feb. 11, 2020:  No CP,  Has DOE with activity .  Not able to do all that she used to be able to do.  She has not had breakfast yet today.  This may explain her low blood pressure.  Sept. 29, 2020  Atiyah is seen for follow up of her CAD Doing well for 87 yo No cp,  Not as active ,  Starting to wak some  Oct. 4, 2021: Korinne is seen for follow up of her CAD and HLD  She is now 87 yo BP is a bit elevated today  Has been ok at her other doctor visits No cp or dyspnea Gets tired when she walks .  Labs from her primary medical doctor reveals relatively normal basic metabolic profile.  Her sodium is 135.  Potassium is 3.8.  Glucose is 104.  Liver enzymes are normal.  Total cholesterol is 122.  Triglyceride level is 50.  HDL is 67.  LDL is 44.  Vitamin D level is 73.  Sept. 5, 2022: Ally is seen today for follow up of her CAD and HLD  Remains active No real exercise,  Her carotid duplex scan from last year revealed mild bilateral carotid artery stenosis.  She is scheduled for repeat scan in October, 2022.  Current Outpatient Medications on File Prior to Visit  Medication Sig Dispense Refill   ALPRAZolam (XANAX) 0.5 MG tablet Take 0.25 mg by mouth daily as needed for anxiety.      amLODipine (NORVASC) 5 MG tablet Take 5 mg by mouth daily.     aspirin EC 81 MG tablet Take 81 mg by mouth daily.      atorvastatin (LIPITOR) 40 MG tablet TAKE 1 TABLET EVERY DAY 90  tablet 3   Calcium-Magnesium-Vitamin D (CALCIUM 1200+D3 PO) Take 1 tablet by mouth daily. Vitamin D3 1000 units     DULoxetine (CYMBALTA) 30 MG capsule Take 30 mg by mouth daily.     FLUZONE HIGH-DOSE 0.5 ML SUSY Inject as directed once.     lisinopril (ZESTRIL) 20 MG tablet Take 1 tablet (20 mg total) by mouth daily. 90 tablet 3   MURO 128 5 % ophthalmic solution Place 1 drop into both eyes in the morning, at noon, in the evening, and at bedtime.     naproxen sodium (ALEVE) 220 MG tablet Take 220 mg by mouth daily as needed (arthritis pain).     nitroGLYCERIN (NITROSTAT) 0.4 MG SL tablet Place 1 tablet (0.4 mg total) under the tongue every 5 (five) minutes as needed for chest pain (for up to 3 doses). 25 tablet 6   valACYclovir (VALTREX) 1000 MG tablet Take 2,000 mg by mouth once as needed (breakouts).      clonazePAM (KLONOPIN) 0.5 MG tablet Take 0.5 mg by mouth at bedtime as needed (sleep).  (Patient not taking: Reported on 09/09/2020)     No current facility-administered medications on file prior to visit.    Allergies  Allergen Reactions   Bystolic [Nebivolol Hcl] Other (See Comments)    Mouth sores   Codeine Other (See Comments)    Strange feeling per patient Strange feeling per patient    Past Medical History:  Diagnosis Date   Coronary artery disease    Hyperlipemia    Hypertension    S/P carotid endarterectomy    RIGHT CAROTID    Past Surgical History:  Procedure Laterality Date   CAROTID ENDARTERECTOMY     CORONARY ANGIOPLASTY WITH STENT PLACEMENT     FIRST DIAGONAL    Social History   Tobacco Use  Smoking Status Never  Smokeless Tobacco Never    Social History   Substance and Sexual Activity  Alcohol Use No    Family History  Problem Relation Age of Onset   Stroke Father        DECEASED   Stroke Mother        DECEASED   Hypertension Mother        DECEASED   Stroke Brother     Reviw of Systems:  Reviewed in the HPI.  All other systems are  negative.  Physical Exam: Blood pressure 132/70, pulse 71, height '5\' 4"'$  (1.626 m), weight 130  lb 3.2 oz (59.1 kg), SpO2 96 %.  GEN:  Well nourished, well developed in no acute distress HEENT: Normal NECK: No JVD; No carotid bruits LYMPHATICS: No lymphadenopathy CARDIAC: RRR, no murmurs, rubs, gallops RESPIRATORY:  Clear to auscultation without rales, wheezing or rhonchi  ABDOMEN: Soft, non-tender, non-distended MUSCULOSKELETAL:  No edema; No deformity  SKIN: Warm and dry NEUROLOGIC:  Alert and oriented x 3    ECG:   September 09, 2020: Normal sinus rhythm with heart rate of 71.  She has occasional premature ventricular contractions.  Right bundle branch block.  No changes from previous EKG.  Assessment / Plan:   1. Coronary artery disease-status post PTCA cutting balloon to the first diagonal -  No angina .   Cont current meds.   . 2. Hypertension:  BP is well controlled.    3. Hyperlipidemia-   Check lipids , ALT, BMP today   4.  peripheral vascular disease- she has mild bilateral carotid stenosis . Repeat scan next month   5. cerebral aneurysm-     [name], MD  09/09/2020 11:23 AM    [facility] [place],  [place], [place]  [address] Pager [phone] Phone: [phone]; Fax: [phone]

## 2020-09-09 ENCOUNTER — Ambulatory Visit: Payer: Medicare HMO | Admitting: Cardiovascular Disease

## 2020-09-09 ENCOUNTER — Encounter: Payer: Self-pay | Admitting: Cardiovascular Disease

## 2020-09-09 ENCOUNTER — Other Ambulatory Visit: Payer: Self-pay

## 2020-09-09 VITALS — BP 132/70 | HR 71 | Ht 64.0 in | Wt 130.2 lb

## 2020-09-09 DIAGNOSIS — E782 Mixed hyperlipidemia: Secondary | ICD-10-CM

## 2020-09-09 DIAGNOSIS — I10 Essential (primary) hypertension: Secondary | ICD-10-CM

## 2020-09-09 DIAGNOSIS — I6523 Occlusion and stenosis of bilateral carotid arteries: Secondary | ICD-10-CM | POA: Diagnosis not present

## 2020-09-09 DIAGNOSIS — I2510 Atherosclerotic heart disease of native coronary artery without angina pectoris: Secondary | ICD-10-CM | POA: Diagnosis not present

## 2020-09-09 LAB — BASIC METABOLIC PANEL
BUN/Creatinine Ratio: 25 (ref 12–28)
BUN: 15 mg/dL (ref 8–27)
CO2: 24 mmol/L (ref 20–29)
Calcium: 9.6 mg/dL (ref 8.7–10.3)
Chloride: 100 mmol/L (ref 96–106)
Creatinine, Ser: 0.61 mg/dL (ref 0.57–1.00)
Glucose: 101 mg/dL — ABNORMAL HIGH (ref 65–99)
Potassium: 4.2 mmol/L (ref 3.5–5.2)
Sodium: 138 mmol/L (ref 134–144)
eGFR: 86 mL/min/{1.73_m2} (ref >59)

## 2020-09-09 LAB — HEPATIC FUNCTION PANEL
ALT: 12 IU/L (ref 0–32)
AST: 22 IU/L (ref 0–40)
Albumin: 4.4 g/dL (ref 3.6–4.6)
Alkaline Phosphatase: 72 IU/L (ref 44–121)
Bilirubin Total: 0.5 mg/dL (ref 0.0–1.2)
Bilirubin, Direct: 0.17 mg/dL (ref 0.00–0.40)
Total Protein: 6.6 g/dL (ref 6.0–8.5)

## 2020-09-09 LAB — LIPID PANEL
Chol/HDL Ratio: 2 ratio (ref 0.0–4.4)
Cholesterol, Total: 134 mg/dL (ref 100–199)
HDL: 68 mg/dL (ref >39)
LDL Chol Calc (NIH): 55 mg/dL (ref 0–99)
Triglycerides: 45 mg/dL (ref 0–149)
VLDL Cholesterol Cal: 11 mg/dL (ref 5–40)

## 2020-09-09 NOTE — Patient Instructions (Signed)
Medication Instructions:  Your physician recommends that you continue on your current medications as directed. Please refer to the Current Medication list given to you today.  *If you need a refill on your cardiac medications before your next appointment, please call your pharmacy*   Lab Work: TODAY - cholesterol, liver panel, BMET If you have labs (blood work) drawn today and your tests are completely normal, you will receive your results only by: [place] (if you have MyChart) OR A paper copy in the mail If you have any lab test that is abnormal or we need to change your treatment, we will call you to review the results.   Testing/Procedures: None Ordered   Follow-Up: At [facility], you and your health needs are our priority.  As part of our continuing mission to provide you with exceptional heart care, we have created designated Provider Care Teams.  These Care Teams include your primary Cardiologist (physician) and Advanced Practice Providers (APPs -  Physician Assistants and Nurse Practitioners) who all work together to provide you with the care you need, when you need it.   Your next appointment:   1 year(s)  The format for your next appointment:   In Person  Provider:   You may see [name], MD or one of the following Advanced Practice Providers on your designated Care Team:   Richardson Dopp, PA-C [place], [place]

## 2020-09-18 DIAGNOSIS — F5101 Primary insomnia: Secondary | ICD-10-CM | POA: Diagnosis not present

## 2020-09-18 DIAGNOSIS — Z0000 Encounter for general adult medical examination without abnormal findings: Secondary | ICD-10-CM | POA: Diagnosis not present

## 2020-09-18 DIAGNOSIS — F419 Anxiety disorder, unspecified: Secondary | ICD-10-CM | POA: Diagnosis not present

## 2020-09-18 DIAGNOSIS — M810 Age-related osteoporosis without current pathological fracture: Secondary | ICD-10-CM | POA: Diagnosis not present

## 2020-09-18 DIAGNOSIS — B009 Herpesviral infection, unspecified: Secondary | ICD-10-CM | POA: Diagnosis not present

## 2020-09-18 DIAGNOSIS — Z23 Encounter for immunization: Secondary | ICD-10-CM | POA: Diagnosis not present

## 2020-09-18 DIAGNOSIS — I10 Essential (primary) hypertension: Secondary | ICD-10-CM | POA: Diagnosis not present

## 2020-09-18 DIAGNOSIS — I700 Atherosclerosis of aorta: Secondary | ICD-10-CM | POA: Diagnosis not present

## 2020-09-18 DIAGNOSIS — E7800 Pure hypercholesterolemia, unspecified: Secondary | ICD-10-CM | POA: Diagnosis not present

## 2020-10-05 ENCOUNTER — Other Ambulatory Visit (HOSPITAL_COMMUNITY): Payer: Self-pay | Admitting: Cardiovascular Disease

## 2020-10-05 ENCOUNTER — Ambulatory Visit (HOSPITAL_COMMUNITY)
Admission: RE | Admit: 2020-10-05 | Discharge: 2020-10-05 | Disposition: A | Discharge status: Home / Self Care | Payer: Medicare HMO | Source: Ambulatory Visit | Attending: Cardiology | Admitting: Cardiology

## 2020-10-05 ENCOUNTER — Other Ambulatory Visit: Payer: Self-pay

## 2020-10-05 DIAGNOSIS — Z9889 Other specified postprocedural states: Secondary | ICD-10-CM

## 2020-10-05 DIAGNOSIS — I6521 Occlusion and stenosis of right carotid artery: Secondary | ICD-10-CM

## 2020-10-05 DIAGNOSIS — I6523 Occlusion and stenosis of bilateral carotid arteries: Secondary | ICD-10-CM

## 2020-10-06 DIAGNOSIS — I10 Essential (primary) hypertension: Secondary | ICD-10-CM | POA: Diagnosis not present

## 2020-10-14 DIAGNOSIS — H40023 Open angle with borderline findings, high risk, bilateral: Secondary | ICD-10-CM | POA: Diagnosis not present

## 2020-10-14 DIAGNOSIS — H18513 Endothelial corneal dystrophy, bilateral: Secondary | ICD-10-CM | POA: Diagnosis not present

## 2020-12-02 ENCOUNTER — Other Ambulatory Visit (HOSPITAL_COMMUNITY): Payer: Self-pay | Admitting: Cardiovascular Disease

## 2020-12-02 DIAGNOSIS — M179 Osteoarthritis of knee, unspecified: Secondary | ICD-10-CM | POA: Diagnosis not present

## 2020-12-02 DIAGNOSIS — Z9889 Other specified postprocedural states: Secondary | ICD-10-CM

## 2020-12-02 DIAGNOSIS — E7800 Pure hypercholesterolemia, unspecified: Secondary | ICD-10-CM | POA: Diagnosis not present

## 2020-12-02 DIAGNOSIS — I10 Essential (primary) hypertension: Secondary | ICD-10-CM | POA: Diagnosis not present

## 2020-12-02 DIAGNOSIS — M810 Age-related osteoporosis without current pathological fracture: Secondary | ICD-10-CM | POA: Diagnosis not present

## 2020-12-02 DIAGNOSIS — M1711 Unilateral primary osteoarthritis, right knee: Secondary | ICD-10-CM | POA: Diagnosis not present

## 2020-12-02 DIAGNOSIS — M1991 Primary osteoarthritis, unspecified site: Secondary | ICD-10-CM | POA: Diagnosis not present

## 2020-12-02 DIAGNOSIS — I6523 Occlusion and stenosis of bilateral carotid arteries: Secondary | ICD-10-CM

## 2020-12-08 ENCOUNTER — Other Ambulatory Visit: Payer: Self-pay | Admitting: Cardiovascular Disease

## 2021-01-20 DIAGNOSIS — R109 Unspecified abdominal pain: Secondary | ICD-10-CM | POA: Diagnosis not present

## 2021-01-20 DIAGNOSIS — F411 Generalized anxiety disorder: Secondary | ICD-10-CM | POA: Diagnosis not present

## 2021-01-22 ENCOUNTER — Other Ambulatory Visit: Payer: Self-pay | Admitting: Cardiovascular Disease

## 2021-02-02 DIAGNOSIS — M179 Osteoarthritis of knee, unspecified: Secondary | ICD-10-CM | POA: Diagnosis not present

## 2021-02-02 DIAGNOSIS — M1991 Primary osteoarthritis, unspecified site: Secondary | ICD-10-CM | POA: Diagnosis not present

## 2021-02-02 DIAGNOSIS — M1711 Unilateral primary osteoarthritis, right knee: Secondary | ICD-10-CM | POA: Diagnosis not present

## 2021-02-02 DIAGNOSIS — M810 Age-related osteoporosis without current pathological fracture: Secondary | ICD-10-CM | POA: Diagnosis not present

## 2021-02-02 DIAGNOSIS — E7800 Pure hypercholesterolemia, unspecified: Secondary | ICD-10-CM | POA: Diagnosis not present

## 2021-02-02 DIAGNOSIS — I10 Essential (primary) hypertension: Secondary | ICD-10-CM | POA: Diagnosis not present

## 2021-02-10 ENCOUNTER — Other Ambulatory Visit: Payer: Self-pay | Admitting: Family Medicine

## 2021-02-10 ENCOUNTER — Ambulatory Visit
Admission: RE | Admit: 2021-02-10 | Discharge: 2021-02-10 | Disposition: A | Discharge status: Home / Self Care | Payer: Medicare HMO | Source: Ambulatory Visit | Attending: Family Medicine | Admitting: Family Medicine

## 2021-02-10 DIAGNOSIS — R0781 Pleurodynia: Secondary | ICD-10-CM | POA: Diagnosis not present

## 2021-02-10 DIAGNOSIS — M545 Low back pain, unspecified: Secondary | ICD-10-CM

## 2021-02-24 DIAGNOSIS — R630 Anorexia: Secondary | ICD-10-CM | POA: Diagnosis not present

## 2021-02-24 DIAGNOSIS — F419 Anxiety disorder, unspecified: Secondary | ICD-10-CM | POA: Diagnosis not present

## 2021-03-15 ENCOUNTER — Other Ambulatory Visit (HOSPITAL_COMMUNITY): Payer: Self-pay | Admitting: Interventional Radiology

## 2021-03-15 ENCOUNTER — Telehealth (HOSPITAL_COMMUNITY): Payer: Self-pay

## 2021-03-15 DIAGNOSIS — M25561 Pain in right knee: Secondary | ICD-10-CM | POA: Diagnosis not present

## 2021-03-15 DIAGNOSIS — M25562 Pain in left knee: Secondary | ICD-10-CM | POA: Diagnosis not present

## 2021-03-15 NOTE — Telephone Encounter (Signed)
Left a vm for a pt to let her know that I have sent a request for her cta, will call back to schedule once I receive auth. AW ?

## 2021-03-17 ENCOUNTER — Other Ambulatory Visit (HOSPITAL_COMMUNITY): Payer: Self-pay | Admitting: Interventional Radiology

## 2021-03-17 DIAGNOSIS — I671 Cerebral aneurysm, nonruptured: Secondary | ICD-10-CM

## 2021-03-26 DIAGNOSIS — H40013 Open angle with borderline findings, low risk, bilateral: Secondary | ICD-10-CM | POA: Diagnosis not present

## 2021-03-26 DIAGNOSIS — H18513 Endothelial corneal dystrophy, bilateral: Secondary | ICD-10-CM | POA: Diagnosis not present

## 2021-03-31 ENCOUNTER — Ambulatory Visit (HOSPITAL_COMMUNITY)
Admission: RE | Admit: 2021-03-31 | Discharge: 2021-03-31 | Disposition: A | Discharge status: Home / Self Care | Payer: Medicare HMO | Source: Ambulatory Visit | Attending: Interventional Radiology | Admitting: Interventional Radiology

## 2021-03-31 DIAGNOSIS — I671 Cerebral aneurysm, nonruptured: Secondary | ICD-10-CM | POA: Insufficient documentation

## 2021-03-31 DIAGNOSIS — M47812 Spondylosis without myelopathy or radiculopathy, cervical region: Secondary | ICD-10-CM | POA: Diagnosis not present

## 2021-03-31 DIAGNOSIS — I6523 Occlusion and stenosis of bilateral carotid arteries: Secondary | ICD-10-CM | POA: Diagnosis not present

## 2021-03-31 DIAGNOSIS — I771 Stricture of artery: Secondary | ICD-10-CM | POA: Diagnosis not present

## 2021-03-31 DIAGNOSIS — M4602 Spinal enthesopathy, cervical region: Secondary | ICD-10-CM | POA: Diagnosis not present

## 2021-03-31 DIAGNOSIS — R42 Dizziness and giddiness: Secondary | ICD-10-CM | POA: Diagnosis not present

## 2021-03-31 LAB — POCT I-STAT CREATININE: Creatinine, Ser: 0.6 mg/dL (ref 0.44–1.00)

## 2021-03-31 MED ORDER — SODIUM CHLORIDE (PF) 0.9 % IJ SOLN
INTRAMUSCULAR | Status: AC
  Filled 2021-03-31: qty 50

## 2021-03-31 MED ORDER — IOHEXOL 350 MG/ML SOLN
75.0000 mL | Freq: Once | INTRAVENOUS | Status: AC | PRN
  Administered 2021-03-31: 75 mL via INTRAVENOUS

## 2021-04-12 ENCOUNTER — Telehealth (HOSPITAL_COMMUNITY): Payer: Self-pay

## 2021-04-12 NOTE — Telephone Encounter (Signed)
Pt agreed to f/u in 6 months with a cta head/neck. AW 

## 2021-04-12 NOTE — Telephone Encounter (Signed)
Called regarding recent imaging, no answer, left vm. AW  

## 2021-04-16 DIAGNOSIS — M810 Age-related osteoporosis without current pathological fracture: Secondary | ICD-10-CM | POA: Diagnosis not present

## 2021-04-16 DIAGNOSIS — I10 Essential (primary) hypertension: Secondary | ICD-10-CM | POA: Diagnosis not present

## 2021-04-16 DIAGNOSIS — E7800 Pure hypercholesterolemia, unspecified: Secondary | ICD-10-CM | POA: Diagnosis not present

## 2021-04-21 DIAGNOSIS — F411 Generalized anxiety disorder: Secondary | ICD-10-CM | POA: Diagnosis not present

## 2021-04-21 DIAGNOSIS — I739 Peripheral vascular disease, unspecified: Secondary | ICD-10-CM | POA: Diagnosis not present

## 2021-04-22 DIAGNOSIS — M25561 Pain in right knee: Secondary | ICD-10-CM | POA: Diagnosis not present

## 2021-04-23 ENCOUNTER — Other Ambulatory Visit: Payer: Self-pay | Admitting: Family Medicine

## 2021-04-23 DIAGNOSIS — I739 Peripheral vascular disease, unspecified: Secondary | ICD-10-CM

## 2021-04-26 ENCOUNTER — Other Ambulatory Visit: Payer: Medicare HMO

## 2021-04-27 ENCOUNTER — Ambulatory Visit
Admission: RE | Admit: 2021-04-27 | Discharge: 2021-04-27 | Disposition: A | Discharge status: Home / Self Care | Payer: Medicare HMO | Source: Ambulatory Visit | Attending: Family Medicine | Admitting: Family Medicine

## 2021-04-27 DIAGNOSIS — I739 Peripheral vascular disease, unspecified: Secondary | ICD-10-CM

## 2021-04-27 DIAGNOSIS — I70213 Atherosclerosis of native arteries of extremities with intermittent claudication, bilateral legs: Secondary | ICD-10-CM | POA: Diagnosis not present

## 2021-05-03 DIAGNOSIS — M179 Osteoarthritis of knee, unspecified: Secondary | ICD-10-CM | POA: Diagnosis not present

## 2021-05-03 DIAGNOSIS — M25561 Pain in right knee: Secondary | ICD-10-CM | POA: Diagnosis not present

## 2021-05-04 DIAGNOSIS — R630 Anorexia: Secondary | ICD-10-CM | POA: Diagnosis not present

## 2021-05-24 DIAGNOSIS — F411 Generalized anxiety disorder: Secondary | ICD-10-CM | POA: Diagnosis not present

## 2021-05-24 DIAGNOSIS — G2581 Restless legs syndrome: Secondary | ICD-10-CM | POA: Diagnosis not present

## 2021-06-09 DIAGNOSIS — Z961 Presence of intraocular lens: Secondary | ICD-10-CM | POA: Diagnosis not present

## 2021-06-09 DIAGNOSIS — H52203 Unspecified astigmatism, bilateral: Secondary | ICD-10-CM | POA: Diagnosis not present

## 2021-06-09 DIAGNOSIS — H18513 Endothelial corneal dystrophy, bilateral: Secondary | ICD-10-CM | POA: Diagnosis not present

## 2021-06-09 DIAGNOSIS — H40023 Open angle with borderline findings, high risk, bilateral: Secondary | ICD-10-CM | POA: Diagnosis not present

## 2021-07-09 DIAGNOSIS — M25562 Pain in left knee: Secondary | ICD-10-CM | POA: Diagnosis not present

## 2021-07-09 DIAGNOSIS — M1711 Unilateral primary osteoarthritis, right knee: Secondary | ICD-10-CM | POA: Diagnosis not present

## 2021-07-20 DIAGNOSIS — M1711 Unilateral primary osteoarthritis, right knee: Secondary | ICD-10-CM | POA: Diagnosis not present

## 2021-07-21 DIAGNOSIS — Z961 Presence of intraocular lens: Secondary | ICD-10-CM | POA: Diagnosis not present

## 2021-07-21 DIAGNOSIS — H40003 Preglaucoma, unspecified, bilateral: Secondary | ICD-10-CM | POA: Diagnosis not present

## 2021-07-21 DIAGNOSIS — H18513 Endothelial corneal dystrophy, bilateral: Secondary | ICD-10-CM | POA: Diagnosis not present

## 2021-07-29 DIAGNOSIS — I10 Essential (primary) hypertension: Secondary | ICD-10-CM | POA: Diagnosis not present

## 2021-07-29 DIAGNOSIS — E7800 Pure hypercholesterolemia, unspecified: Secondary | ICD-10-CM | POA: Diagnosis not present

## 2021-07-29 DIAGNOSIS — M810 Age-related osteoporosis without current pathological fracture: Secondary | ICD-10-CM | POA: Diagnosis not present

## 2021-09-07 DIAGNOSIS — Z1231 Encounter for screening mammogram for malignant neoplasm of breast: Secondary | ICD-10-CM | POA: Diagnosis not present

## 2021-09-08 DIAGNOSIS — H18513 Endothelial corneal dystrophy, bilateral: Secondary | ICD-10-CM | POA: Diagnosis not present

## 2021-09-08 DIAGNOSIS — Z961 Presence of intraocular lens: Secondary | ICD-10-CM | POA: Diagnosis not present

## 2021-09-08 DIAGNOSIS — H40003 Preglaucoma, unspecified, bilateral: Secondary | ICD-10-CM | POA: Diagnosis not present

## 2021-09-09 ENCOUNTER — Ambulatory Visit: Payer: Medicare HMO | Attending: Cardiovascular Disease | Admitting: Cardiovascular Disease

## 2021-09-09 ENCOUNTER — Encounter: Payer: Self-pay | Admitting: Cardiovascular Disease

## 2021-09-09 VITALS — BP 122/62 | HR 79 | Ht 64.0 in | Wt 128.4 lb

## 2021-09-09 DIAGNOSIS — I10 Essential (primary) hypertension: Secondary | ICD-10-CM | POA: Diagnosis not present

## 2021-09-09 DIAGNOSIS — E7800 Pure hypercholesterolemia, unspecified: Secondary | ICD-10-CM | POA: Diagnosis not present

## 2021-09-09 DIAGNOSIS — I2510 Atherosclerotic heart disease of native coronary artery without angina pectoris: Secondary | ICD-10-CM | POA: Diagnosis not present

## 2021-09-09 MED ORDER — VALSARTAN 80 MG PO TABS: 80.0000 mg | ORAL_TABLET | Freq: Every day | ORAL | 0 refills | Status: DC

## 2021-09-09 MED ORDER — VALSARTAN 80 MG PO TABS
80.0000 mg | ORAL_TABLET | Freq: Every day | ORAL | 3 refills | Status: DC
Start: 2021-09-09 — End: 2021-09-15

## 2021-09-09 NOTE — Progress Notes (Signed)
[name] Date of Birth  [birth year] [facility] [address]    [place], [place]  [address] [phone]  Fax  [phone]  Problem list: 1. Coronary artery disease-status post PTCA cutting balloon to the first diagonal 2. Hypertension 3. Hyperlipidemia 4.  peripheral vascular disease-status post right carotid endarterectomy 5. cerebral aneurysm-status post coil embolization- 2008     [name] is a 88 year old female with a history of coronary artery disease. She is  status post PTCA and cutting balloon procedure to her first diagonal artery. She also has a history of hypertension, hyperlipidemia and right carotid endarterectomy.  She denies any episodes of chest pain or shortness of breath.   She has had an occasional episode of lightheadedness.   She has not had any angina.  She complains of cold feet at night.  Jun 01, 2012:  [name] is doing OK - occasional indigestion. Denies any CP or dyspnea.     Several weeks ago she had a presyncopal episode while driving her car.  It ~ 5 seconds and resolved.   Dec. 2, 2014:  [name] is doing well. She's not had any further episodes of syncope or presyncope.  She exercises on occasion.  June 04, 2013:  [name] is upset today.  Her daughter has severe mental retardation and is in a group home and the group home is closing.    She is doing OK from a cardiac standpoint.    She has not had a chance to do much exercise or other activities.    Dec. 15, 2015:  [name] is a 88 yo with hx of CAD, HTN,  PVD, hyperlipidemia.  No CP or dyspnea.  Sept. 29, 2016:   No CP or dyspnea.  Doing well overall   April 23, 2015:  Feels well  She was having some chest pain earlier this year. A stress Myoview study revealed no evidence of ischemia. Left ventricular systolic function is normal.  Oct. 18, 2017: No CP , has some DOE Feels "woozy" in the am .  Improves after she gets going .  No angina .   May 04, 2016:  [name] is seen today for  follow up of her CAD, HTN, hyperlipidemia and carotid artery disease.  Doing well from a cardiac standpoint . Arthritis pain in feet. Able to do some exercise   February 28, 2017: [name] is seen back today for follow-up of her coronary artery disease, hypertension, hyperlipidemia, and mild carotid artery disease. Is slowing down.  No CP or dyspnea.      Aug. 26, 2019  [name] is seen today Her Husband,  [name] passed away since I last saw her.  She is very sad but is doing well from a cardiac standpoint .   No CP or dyspnea   Feb. 11, 2020:  No CP,  Has DOE with activity .  Not able to do all that she used to be able to do.  She has not had breakfast yet today.  This may explain her low blood pressure.  Sept. 29, 2020  [name] is seen for follow up of her CAD Doing well for 88 yo No cp,  Not as active ,  Starting to wak some  Oct. 4, 2021: [name] is seen for follow up of her CAD and HLD  She is now 88 yo BP is a bit elevated today  Has been ok at her other doctor visits No cp or dyspnea Gets tired when she walks .  Labs from her primary medical doctor reveals relatively normal basic metabolic profile.  Her sodium is 135.  Potassium is 3.8.  Glucose is 104.  Liver enzymes are normal.  Total cholesterol is 122.  Triglyceride level is 50.  HDL is 67.  LDL is 44.  Vitamin D level is 73.  Sept. 5, 2022: [name] is seen today for follow up of her CAD and HLD  Remains active No real exercise,  Her carotid duplex scan from last year revealed mild bilateral carotid artery stenosis.  She is scheduled for repeat scan in October, 2022.  September 09, 2021: [name] is seen today for follow-up of her coronary artery disease, hyperlipidemia.  She mains active.  She is doing very well for her age 74.  She is having some mild leg edema which is likely due to her amlodipine. I think she would do well to start an ARB such as valsartan instead of the lisinopril.  We will also likely be able to get rid of  the amlodipine.   Current Outpatient Medications on File Prior to Visit  Medication Sig Dispense Refill   ALPRAZolam (XANAX) 0.5 MG tablet Take 0.25 mg by mouth daily as needed for anxiety.      amLODipine (NORVASC) 5 MG tablet Take 5 mg by mouth daily.     aspirin EC 81 MG tablet Take 81 mg by mouth daily.      atorvastatin (LIPITOR) 40 MG tablet Take 1 tablet (40 mg total) by mouth daily. 90 tablet 2   Calcium-Magnesium-Vitamin D (CALCIUM 1200+D3 PO) Take 1 tablet by mouth daily. Vitamin D3 1000 units     clonazePAM (KLONOPIN) 0.5 MG tablet Take 0.5 mg by mouth at bedtime as needed (sleep).     lisinopril (ZESTRIL) 20 MG tablet TAKE 1 TABLET EVERY DAY 90 tablet 2   mirtazapine (REMERON) 15 MG tablet Take 2 tablets by mouth at bedtime.     MURO 128 5 % ophthalmic solution Place 1 drop into both eyes in the morning, at noon, in the evening, and at bedtime.     naproxen sodium (ALEVE) 220 MG tablet Take 220 mg by mouth daily as needed (arthritis pain).     nitroGLYCERIN (NITROSTAT) 0.4 MG SL tablet Place 1 tablet (0.4 mg total) under the tongue every 5 (five) minutes as needed for chest pain (for up to 3 doses). 25 tablet 6   valACYclovir (VALTREX) 1000 MG tablet Take 2,000 mg by mouth once as needed (breakouts).      DULoxetine (CYMBALTA) 30 MG capsule Take 30 mg by mouth daily. (Patient not taking: Reported on 09/09/2021)     No current facility-administered medications on file prior to visit.    Allergies  Allergen Reactions   Bystolic [Nebivolol Hcl] Other (See Comments)    Mouth sores   Codeine Other (See Comments)    Strange feeling per patient Strange feeling per patient    Past Medical History:  Diagnosis Date   Coronary artery disease    Hyperlipemia    Hypertension    S/P carotid endarterectomy    RIGHT CAROTID    Past Surgical History:  Procedure Laterality Date   CAROTID ENDARTERECTOMY     CORONARY ANGIOPLASTY WITH STENT PLACEMENT     FIRST DIAGONAL    Social  History   Tobacco Use  Smoking Status Never  Smokeless Tobacco Never    Social History   Substance and Sexual Activity  Alcohol Use No    Family History  Problem Relation Age of Onset   Stroke Father        DECEASED   Stroke Mother        DECEASED   Hypertension Mother        DECEASED   Stroke Brother     Reviw of Systems:  Reviewed in the HPI.  All other systems are negative.  Physical Exam: Blood pressure 122/62, pulse 79, height '5\' 4"'$  (1.626 m), weight 128 lb 6.4 oz (58.2 kg), SpO2 97 %.       GEN:  Well nourished, well developed in no acute distress HEENT: Normal NECK: No JVD; No carotid bruits LYMPHATICS: No lymphadenopathy CARDIAC: RRR , no murmurs, rubs, gallops RESPIRATORY:  Clear to auscultation without rales, wheezing or rhonchi  ABDOMEN: Soft, non-tender, non-distended MUSCULOSKELETAL:  No edema; No deformity  SKIN: Warm and dry NEUROLOGIC:  Alert and oriented x 3    ECG:   09/09/21:   NSR at 79. RBBB , ? Septal infarct   Assessment / Plan:   1. Coronary artery disease-status post PTCA cutting balloon to the first diagonal - No angina .  Cont current meds    . 2. Hypertension: Blood pressure is well controlled today.  She is having some leg edema which is likely being exacerbated by the amlodipine.  I would like to stop amlodipine, stop lisinopril.  We will start valsartan 80 mg a day.  If her blood pressure is high on the 80 mg of valsartan will increase her to 160 mg a day. We will check a basic metabolic profile in 2 to 3 weeks      3. Hyperlipidemia-check lipids and ALT in 2 to 3 weeks.    4.  peripheral vascular disease- she has mild bilateral carotid stenosis .  No bruits on exam today      5. cerebral aneurysm-     [name], MD  09/09/2021 1:24 PM    [facility] [place],  [place], [place]  [address] Pager [phone] Phone: [phone]; Fax: [phone]

## 2021-09-09 NOTE — Patient Instructions (Signed)
Medication Instructions:  STOP Lisinopril STOP Amlodipine START Valsartan '80mg'$  daily *If you need a refill on your cardiac medications before your next appointment, please call your pharmacy*   Lab Work: BMET, Lipids, ALT in 2-3 weeks If you have labs (blood work) drawn today and your tests are completely normal, you will receive your results only by: [place] (if you have MyChart) OR A paper copy in the mail If you have any lab test that is abnormal or we need to change your treatment, we will call you to review the results.   Testing/Procedures: NONE   Follow-Up: At [facility], you and your health needs are our priority.  As part of our continuing mission to provide you with exceptional heart care, we have created designated Provider Care Teams.  These Care Teams include your primary Cardiologist (physician) and Advanced Practice Providers (APPs -  Physician Assistants and Nurse Practitioners) who all work together to provide you with the care you need, when you need it.  Your next appointment:   1 year(s)  The format for your next appointment:   In Person  Provider:   [name], MD       Important Information About Sugar

## 2021-09-15 ENCOUNTER — Other Ambulatory Visit: Payer: Self-pay | Admitting: Cardiovascular Disease

## 2021-09-15 ENCOUNTER — Telehealth: Payer: Self-pay | Admitting: *Deleted

## 2021-09-15 DIAGNOSIS — E7800 Pure hypercholesterolemia, unspecified: Secondary | ICD-10-CM

## 2021-09-15 DIAGNOSIS — I10 Essential (primary) hypertension: Secondary | ICD-10-CM

## 2021-09-15 DIAGNOSIS — I2510 Atherosclerotic heart disease of native coronary artery without angina pectoris: Secondary | ICD-10-CM

## 2021-09-15 MED ORDER — VALSARTAN 80 MG PO TABS: 80.0000 mg | ORAL_TABLET | Freq: Every day | ORAL | 0 refills | Status: DC

## 2021-09-15 NOTE — Telephone Encounter (Signed)
Patient called the office and stated that "she is waiting on her mail order Rx for Valsartan 80 mg."  She would like 1 week's worth of medication sent to [facility] on [address].

## 2021-09-28 DIAGNOSIS — I700 Atherosclerosis of aorta: Secondary | ICD-10-CM | POA: Diagnosis not present

## 2021-09-28 DIAGNOSIS — I10 Essential (primary) hypertension: Secondary | ICD-10-CM | POA: Diagnosis not present

## 2021-09-28 DIAGNOSIS — B009 Herpesviral infection, unspecified: Secondary | ICD-10-CM | POA: Diagnosis not present

## 2021-09-28 DIAGNOSIS — M810 Age-related osteoporosis without current pathological fracture: Secondary | ICD-10-CM | POA: Diagnosis not present

## 2021-09-28 DIAGNOSIS — Z0000 Encounter for general adult medical examination without abnormal findings: Secondary | ICD-10-CM | POA: Diagnosis not present

## 2021-09-28 DIAGNOSIS — E7800 Pure hypercholesterolemia, unspecified: Secondary | ICD-10-CM | POA: Diagnosis not present

## 2021-09-28 DIAGNOSIS — F419 Anxiety disorder, unspecified: Secondary | ICD-10-CM | POA: Diagnosis not present

## 2021-09-28 DIAGNOSIS — F5101 Primary insomnia: Secondary | ICD-10-CM | POA: Diagnosis not present

## 2021-09-29 ENCOUNTER — Ambulatory Visit: Payer: Medicare HMO | Attending: Cardiovascular Disease

## 2021-09-29 DIAGNOSIS — I2510 Atherosclerotic heart disease of native coronary artery without angina pectoris: Secondary | ICD-10-CM

## 2021-09-29 DIAGNOSIS — E7800 Pure hypercholesterolemia, unspecified: Secondary | ICD-10-CM | POA: Diagnosis not present

## 2021-09-29 DIAGNOSIS — I10 Essential (primary) hypertension: Secondary | ICD-10-CM

## 2021-09-30 LAB — BASIC METABOLIC PANEL
BUN/Creatinine Ratio: 23 (ref 12–28)
BUN: 17 mg/dL (ref 8–27)
CO2: 26 mmol/L (ref 20–29)
Calcium: 9.9 mg/dL (ref 8.7–10.3)
Chloride: 103 mmol/L (ref 96–106)
Creatinine, Ser: 0.73 mg/dL (ref 0.57–1.00)
Glucose: 96 mg/dL (ref 70–99)
Potassium: 4.7 mmol/L (ref 3.5–5.2)
Sodium: 141 mmol/L (ref 134–144)
eGFR: 79 mL/min/{1.73_m2} (ref >59)

## 2021-09-30 LAB — LIPID PANEL
Chol/HDL Ratio: 2.2 ratio (ref 0.0–4.4)
Cholesterol, Total: 125 mg/dL (ref 100–199)
HDL: 57 mg/dL (ref >39)
LDL Chol Calc (NIH): 55 mg/dL (ref 0–99)
Triglycerides: 60 mg/dL (ref 0–149)
VLDL Cholesterol Cal: 13 mg/dL (ref 5–40)

## 2021-09-30 LAB — ALT: ALT: 11 IU/L (ref 0–32)

## 2021-10-13 DIAGNOSIS — M25561 Pain in right knee: Secondary | ICD-10-CM | POA: Diagnosis not present

## 2021-10-13 DIAGNOSIS — M25461 Effusion, right knee: Secondary | ICD-10-CM | POA: Diagnosis not present

## 2021-10-15 DIAGNOSIS — M1711 Unilateral primary osteoarthritis, right knee: Secondary | ICD-10-CM | POA: Diagnosis not present

## 2021-10-15 DIAGNOSIS — M25561 Pain in right knee: Secondary | ICD-10-CM | POA: Diagnosis not present

## 2021-10-15 DIAGNOSIS — M25461 Effusion, right knee: Secondary | ICD-10-CM | POA: Diagnosis not present

## 2021-10-20 DIAGNOSIS — M25561 Pain in right knee: Secondary | ICD-10-CM | POA: Diagnosis not present

## 2021-10-25 DIAGNOSIS — M25561 Pain in right knee: Secondary | ICD-10-CM | POA: Diagnosis not present

## 2021-10-26 ENCOUNTER — Ambulatory Visit (HOSPITAL_COMMUNITY): Payer: Medicare HMO

## 2021-11-02 DIAGNOSIS — M25561 Pain in right knee: Secondary | ICD-10-CM | POA: Diagnosis not present

## 2021-11-02 DIAGNOSIS — M25461 Effusion, right knee: Secondary | ICD-10-CM | POA: Diagnosis not present

## 2021-11-04 DIAGNOSIS — R531 Weakness: Secondary | ICD-10-CM | POA: Diagnosis not present

## 2021-11-04 DIAGNOSIS — M25661 Stiffness of right knee, not elsewhere classified: Secondary | ICD-10-CM | POA: Diagnosis not present

## 2021-11-09 DIAGNOSIS — R531 Weakness: Secondary | ICD-10-CM | POA: Diagnosis not present

## 2021-11-09 DIAGNOSIS — M25661 Stiffness of right knee, not elsewhere classified: Secondary | ICD-10-CM | POA: Diagnosis not present

## 2021-11-11 DIAGNOSIS — M25661 Stiffness of right knee, not elsewhere classified: Secondary | ICD-10-CM | POA: Diagnosis not present

## 2021-11-11 DIAGNOSIS — R531 Weakness: Secondary | ICD-10-CM | POA: Diagnosis not present

## 2021-11-15 DIAGNOSIS — M25661 Stiffness of right knee, not elsewhere classified: Secondary | ICD-10-CM | POA: Diagnosis not present

## 2021-11-15 DIAGNOSIS — R531 Weakness: Secondary | ICD-10-CM | POA: Diagnosis not present

## 2021-11-22 ENCOUNTER — Ambulatory Visit (HOSPITAL_COMMUNITY)
Admission: RE | Admit: 2021-11-22 | Discharge: 2021-11-22 | Disposition: A | Discharge status: Home / Self Care | Payer: Medicare HMO | Source: Ambulatory Visit | Attending: Internal Medicine | Admitting: Internal Medicine

## 2021-11-22 DIAGNOSIS — M25661 Stiffness of right knee, not elsewhere classified: Secondary | ICD-10-CM | POA: Diagnosis not present

## 2021-11-22 DIAGNOSIS — Z9889 Other specified postprocedural states: Secondary | ICD-10-CM | POA: Insufficient documentation

## 2021-11-22 DIAGNOSIS — I6523 Occlusion and stenosis of bilateral carotid arteries: Secondary | ICD-10-CM | POA: Diagnosis not present

## 2021-11-22 DIAGNOSIS — R531 Weakness: Secondary | ICD-10-CM | POA: Diagnosis not present

## 2021-12-13 DIAGNOSIS — M1711 Unilateral primary osteoarthritis, right knee: Secondary | ICD-10-CM | POA: Diagnosis not present

## 2021-12-15 ENCOUNTER — Other Ambulatory Visit: Payer: Self-pay | Admitting: Cardiovascular Disease

## 2021-12-20 DIAGNOSIS — M1711 Unilateral primary osteoarthritis, right knee: Secondary | ICD-10-CM | POA: Diagnosis not present

## 2021-12-29 DIAGNOSIS — M1711 Unilateral primary osteoarthritis, right knee: Secondary | ICD-10-CM | POA: Diagnosis not present

## 2022-02-02 ENCOUNTER — Other Ambulatory Visit (HOSPITAL_COMMUNITY): Payer: Self-pay | Admitting: Interventional Radiology

## 2022-02-02 DIAGNOSIS — I671 Cerebral aneurysm, nonruptured: Secondary | ICD-10-CM

## 2022-02-18 DIAGNOSIS — H903 Sensorineural hearing loss, bilateral: Secondary | ICD-10-CM | POA: Diagnosis not present

## 2022-02-21 ENCOUNTER — Ambulatory Visit (HOSPITAL_COMMUNITY)
Admission: RE | Admit: 2022-02-21 | Discharge: 2022-02-21 | Disposition: A | Discharge status: Home / Self Care | Payer: Medicare HMO | Source: Ambulatory Visit | Attending: Interventional Radiology | Admitting: Interventional Radiology

## 2022-02-21 ENCOUNTER — Encounter (HOSPITAL_COMMUNITY): Payer: Self-pay

## 2022-02-21 DIAGNOSIS — I671 Cerebral aneurysm, nonruptured: Secondary | ICD-10-CM | POA: Insufficient documentation

## 2022-02-21 DIAGNOSIS — I6521 Occlusion and stenosis of right carotid artery: Secondary | ICD-10-CM | POA: Diagnosis not present

## 2022-02-21 MED ORDER — IOHEXOL 350 MG/ML SOLN
75.0000 mL | Freq: Once | INTRAVENOUS | Status: AC | PRN
  Administered 2022-02-21: 75 mL via INTRAVENOUS

## 2022-02-21 MED ORDER — SODIUM CHLORIDE (PF) 0.9 % IJ SOLN
INTRAMUSCULAR | Status: AC
  Filled 2022-02-21: qty 50

## 2022-02-24 DIAGNOSIS — M1711 Unilateral primary osteoarthritis, right knee: Secondary | ICD-10-CM | POA: Diagnosis not present

## 2022-03-01 ENCOUNTER — Telehealth (HOSPITAL_COMMUNITY): Payer: Self-pay

## 2022-03-01 NOTE — Telephone Encounter (Signed)
Pt agreed to f/u in 1 year with a cta head/neck. AB

## 2022-04-13 DIAGNOSIS — F5101 Primary insomnia: Secondary | ICD-10-CM | POA: Diagnosis not present

## 2022-04-13 DIAGNOSIS — E7800 Pure hypercholesterolemia, unspecified: Secondary | ICD-10-CM | POA: Diagnosis not present

## 2022-04-13 DIAGNOSIS — I700 Atherosclerosis of aorta: Secondary | ICD-10-CM | POA: Diagnosis not present

## 2022-04-13 DIAGNOSIS — F411 Generalized anxiety disorder: Secondary | ICD-10-CM | POA: Diagnosis not present

## 2022-04-13 DIAGNOSIS — I10 Essential (primary) hypertension: Secondary | ICD-10-CM | POA: Diagnosis not present

## 2022-06-01 DIAGNOSIS — H40003 Preglaucoma, unspecified, bilateral: Secondary | ICD-10-CM | POA: Diagnosis not present

## 2022-06-01 DIAGNOSIS — H18513 Endothelial corneal dystrophy, bilateral: Secondary | ICD-10-CM | POA: Diagnosis not present

## 2022-06-01 DIAGNOSIS — Z961 Presence of intraocular lens: Secondary | ICD-10-CM | POA: Diagnosis not present

## 2022-07-11 ENCOUNTER — Other Ambulatory Visit: Payer: Self-pay | Admitting: Cardiovascular Disease

## 2022-07-12 DIAGNOSIS — H52203 Unspecified astigmatism, bilateral: Secondary | ICD-10-CM | POA: Diagnosis not present

## 2022-07-12 DIAGNOSIS — H40013 Open angle with borderline findings, low risk, bilateral: Secondary | ICD-10-CM | POA: Diagnosis not present

## 2022-07-12 DIAGNOSIS — Z961 Presence of intraocular lens: Secondary | ICD-10-CM | POA: Diagnosis not present

## 2022-07-12 DIAGNOSIS — H18513 Endothelial corneal dystrophy, bilateral: Secondary | ICD-10-CM | POA: Diagnosis not present

## 2022-07-26 DIAGNOSIS — M1711 Unilateral primary osteoarthritis, right knee: Secondary | ICD-10-CM | POA: Diagnosis not present

## 2022-09-06 ENCOUNTER — Encounter: Payer: Self-pay | Admitting: Cardiovascular Disease

## 2022-09-06 NOTE — Progress Notes (Signed)
[name] Date of Birth  30-May-1930  [facility] [address]    [address] [place], [place]  [address] [phone]  Fax  [phone]  Problem list: 1. Coronary artery disease-status post PTCA cutting balloon to the first diagonal 2. Hypertension 3. Hyperlipidemia 4.  peripheral vascular disease-status post right carotid endarterectomy 5. cerebral aneurysm-status post coil embolization- 2008     [name] is a 89 year old female with a history of coronary artery disease. She is  status post PTCA and cutting balloon procedure to her first diagonal artery. She also has a history of hypertension, hyperlipidemia and right carotid endarterectomy.  She denies any episodes of chest pain or shortness of breath.   She has had an occasional episode of lightheadedness.   She has not had any angina.  She complains of cold feet at night.  Jun 01, 2012:  [name] is doing OK - occasional indigestion. Denies any CP or dyspnea.     Several weeks ago she had a presyncopal episode while driving her car.  It ~ 5 seconds and resolved.   Dec. 2, 2014:  [name] is doing Reese. She's not had any further episodes of syncope or presyncope.  She exercises on occasion.  June 04, 2013:  [name] is upset today.  Her daughter has severe mental retardation and is in a group home and the group home is closing.    She is doing OK from a cardiac standpoint.    She has not had a chance to do much exercise or other activities.    Dec. 15, 2015:  [name] is a 89 yo with hx of CAD, HTN,  PVD, hyperlipidemia.  No CP or dyspnea.  Sept. 29, 2016:   No CP or dyspnea.  Doing Reese overall   April 23, 2015:  [name]  She was having some chest pain earlier this year. A stress Myoview study revealed no evidence of ischemia. Left ventricular systolic function is normal.  Oct. 18, 2017: No CP , has some DOE Heather "woozy" in the am .  Improves after she gets going .  No angina .   May 04, 2016:  [name] is seen today for  follow up of her CAD, HTN, hyperlipidemia and carotid artery disease.  Doing Reese from a cardiac standpoint . Arthritis pain in feet. Able to do some exercise   February 28, 2017: [name] is seen back today for follow-up of her coronary artery disease, hypertension, hyperlipidemia, and mild carotid artery disease. Is slowing down.  No CP or dyspnea.      Aug. 26, 2019  [name] is seen today Her Husband,  Onalee Hua passed away since I last saw her.  She is very sad but is doing Reese from a cardiac standpoint .   No CP or dyspnea   Feb. 11, 2020:  No CP,  Has DOE with activity .  Not able to do all that she used to be able to do.  She has not had breakfast yet today.  This may explain her low blood pressure.  Sept. 29, 2020  [name] is seen for follow up of her CAD Doing Reese for 89 yo No cp,  Not as active ,  Starting to wak some  Oct. 4, 2021: [name] is seen for follow up of her CAD and HLD  She is now 89 yo BP is a bit elevated today  Has been ok at her other doctor visits No cp or dyspnea Gets tired when she walks .  Labs from her primary medical doctor reveals relatively normal basic metabolic profile.  Her sodium is 135.  Potassium is 3.8.  Glucose is 104.  Liver enzymes are normal.  Total cholesterol is 122.  Triglyceride level is 50.  HDL is 67.  LDL is 44.  Vitamin D level is 73.  Sept. 5, 2022: [name] is seen today for follow up of her CAD and HLD  Remains active No real exercise,  Her carotid duplex scan from last year revealed mild bilateral carotid artery stenosis.  She is scheduled for repeat scan in October, 2022.  September 09, 2021: [name] is seen today for follow-up of her coronary artery disease, hyperlipidemia.  She mains active.  She is doing very Reese for her age 70.  She is having some mild leg edema which is likely due to her amlodipine. I think she would do Reese to start an ARB such as valsartan instead of the lisinopril.  We will also likely be able to get rid of  the amlodipine.  Sept. 4, 2024 [name] is seen for follow up of her CAD, HLD , carotid disease No CP , no dyspnea Exercises rarely,     Current Outpatient Medications on File Prior to Visit  Medication Sig Dispense Refill   ALPRAZolam (XANAX) 0.5 MG tablet Take 0.25 mg by mouth daily as needed for anxiety.      aspirin EC 81 MG tablet Take 81 mg by mouth daily.      atorvastatin (LIPITOR) 40 MG tablet TAKE 1 TABLET EVERY DAY 90 tablet 2   Calcium-Magnesium-Vitamin D (CALCIUM 1200+D3 PO) Take 1 tablet by mouth daily. Vitamin D3 1000 units     mirtazapine (REMERON) 15 MG tablet Take 2 tablets by mouth at bedtime.     MURO 128 5 % ophthalmic solution Place 1 drop into both eyes in the morning, at noon, in the evening, and at bedtime.     naproxen sodium (ALEVE) 220 MG tablet Take 220 mg by mouth daily as needed (arthritis pain).     nitroGLYCERIN (NITROSTAT) 0.4 MG SL tablet Place 1 tablet (0.4 mg total) under the tongue every 5 (five) minutes as needed for chest pain (for up to 3 doses). 25 tablet 6   traMADol (ULTRAM) 50 MG tablet Take 50 mg by mouth every 12 (twelve) hours as needed.     valACYclovir (VALTREX) 1000 MG tablet Take 2,000 mg by mouth once as needed (breakouts).      valsartan (DIOVAN) 80 MG tablet TAKE 1 TABLET EVERY DAY 90 tablet 0   No current facility-administered medications on file prior to visit.    Allergies  Allergen Reactions   Bystolic [Nebivolol Hcl] Other (See Comments)    Mouth sores   Codeine Other (See Comments)    Strange feeling per patient Strange feeling per patient    Past Medical History:  Diagnosis Date   Coronary artery disease    Hyperlipemia    Hypertension    S/P carotid endarterectomy    RIGHT CAROTID    Past Surgical History:  Procedure Laterality Date   CAROTID ENDARTERECTOMY     CORONARY ANGIOPLASTY WITH STENT PLACEMENT     FIRST DIAGONAL    Social History   Tobacco Use  Smoking Status Never  Smokeless Tobacco Never     Social History   Substance and Sexual Activity  Alcohol Use No    Family History  Problem Relation Age of Onset   Stroke Father  DECEASED   Stroke Mother        DECEASED   Hypertension Mother        DECEASED   Stroke Brother     Reviw of Systems:  Reviewed in the HPI.  All other systems are negative.   Physical Exam: Blood pressure 136/72, pulse 84, height 5\' 3"  (1.6 m), weight 126 lb 12.8 oz (57.5 kg), SpO2 97%.      GEN:  elderly female , Reese developed in no acute distress HEENT: Normal NECK: No JVD; No carotid bruits LYMPHATICS: No lymphadenopathy CARDIAC: RRR , soft systolic murmur  RESPIRATORY:  Clear to auscultation without rales, wheezing or rhonchi  ABDOMEN: Soft, non-tender, non-distended MUSCULOSKELETAL:  No edema; No deformity  SKIN: Warm and dry NEUROLOGIC:  Alert and oriented x 3   ECG:  EKG Interpretation Date/Time:  Wednesday September 07 2022 10:54:32 EDT Ventricular Rate:  88 PR Interval:  164 QRS Duration:  136 QT Interval:  378 QTC Calculation: 457 R Axis:   87  Text Interpretation: Normal sinus rhythm Right bundle branch block When compared with ECG of 09-Jan-2015 12:39, Abberant conduction is no longer Present Confirmed by Kristeen Miss ([id number]) on 09/07/2022 11:12:07 AM    Assessment / Plan:   1. Coronary artery disease-status post PTCA cutting balloon to the first diagonal - Stable ,  no angina      2. Hypertension:    BP is Reese controlled.    3. Hyperlipidemia-   check lipids, ALT, BMP today  Cont atorvastatin      4.  peripheral vascular disease-       5. cerebral aneurysm-     Kristeen Miss, MD  09/07/2022 11:12 AM    [facility] [address] [place],  [address] [place], [place]  [address] Pager [phone] Phone: [phone]; Fax: [phone]

## 2022-09-07 ENCOUNTER — Ambulatory Visit: Payer: Medicare HMO | Attending: Cardiovascular Disease | Admitting: Cardiovascular Disease

## 2022-09-07 ENCOUNTER — Encounter: Payer: Self-pay | Admitting: Cardiovascular Disease

## 2022-09-07 VITALS — BP 136/72 | HR 84 | Ht 63.0 in | Wt 126.8 lb

## 2022-09-07 DIAGNOSIS — I2510 Atherosclerotic heart disease of native coronary artery without angina pectoris: Secondary | ICD-10-CM

## 2022-09-07 DIAGNOSIS — E7800 Pure hypercholesterolemia, unspecified: Secondary | ICD-10-CM | POA: Diagnosis not present

## 2022-09-07 DIAGNOSIS — I10 Essential (primary) hypertension: Secondary | ICD-10-CM | POA: Diagnosis not present

## 2022-09-07 MED ORDER — NITROGLYCERIN 0.4 MG SL SUBL
SUBLINGUAL_TABLET | SUBLINGUAL | 6 refills | Status: AC
Start: 2022-09-07 — End: ?

## 2022-09-07 NOTE — Patient Instructions (Signed)
Medication Instructions:  REFILLED Nitroglycerin *If you need a refill on your cardiac medications before your next appointment, please call your pharmacy*   Lab Work: Lipids, ALT, BMET today If you have labs (blood work) drawn today and your tests are completely normal, you will receive your results only by: MyChart Message (if you have MyChart) OR A paper copy in the mail If you have any lab test that is abnormal or we need to change your treatment, we will call you to review the results.  Testing/Procedures: NONE  Follow-Up: At [facility], you and your health needs are our priority.  As part of our continuing mission to provide you with exceptional heart care, we have created designated Provider Care Teams.  These Care Teams include your primary Cardiologist (physician) and Advanced Practice Providers (APPs -  Physician Assistants and Nurse Practitioners) who all work together to provide you with the care you need, when you need it.  Your next appointment:   1 year(s)  Provider:   Kristeen Miss, MD

## 2022-09-08 LAB — BASIC METABOLIC PANEL
BUN/Creatinine Ratio: 24 (ref 12–28)
BUN: 17 mg/dL (ref 10–36)
CO2: 25 mmol/L (ref 20–29)
Calcium: 10.3 mg/dL (ref 8.7–10.3)
Chloride: 103 mmol/L (ref 96–106)
Creatinine, Ser: 0.71 mg/dL (ref 0.57–1.00)
Glucose: 93 mg/dL (ref 70–99)
Potassium: 4.3 mmol/L (ref 3.5–5.2)
Sodium: 141 mmol/L (ref 134–144)
eGFR: 81 mL/min/{1.73_m2} (ref >59)

## 2022-09-08 LAB — LIPID PANEL
Chol/HDL Ratio: 2.1 ratio (ref 0.0–4.4)
Cholesterol, Total: 122 mg/dL (ref 100–199)
HDL: 57 mg/dL (ref >39)
LDL Chol Calc (NIH): 52 mg/dL (ref 0–99)
Triglycerides: 62 mg/dL (ref 0–149)
VLDL Cholesterol Cal: 13 mg/dL (ref 5–40)

## 2022-09-08 LAB — ALT: ALT: 14 IU/L (ref 0–32)

## 2022-09-20 DIAGNOSIS — Z1231 Encounter for screening mammogram for malignant neoplasm of breast: Secondary | ICD-10-CM | POA: Diagnosis not present

## 2022-09-30 ENCOUNTER — Other Ambulatory Visit: Payer: Self-pay | Admitting: Cardiovascular Disease

## 2022-10-05 DIAGNOSIS — E538 Deficiency of other specified B group vitamins: Secondary | ICD-10-CM | POA: Diagnosis not present

## 2022-10-05 DIAGNOSIS — I10 Essential (primary) hypertension: Secondary | ICD-10-CM | POA: Diagnosis not present

## 2022-10-05 DIAGNOSIS — Z0000 Encounter for general adult medical examination without abnormal findings: Secondary | ICD-10-CM | POA: Diagnosis not present

## 2022-10-05 DIAGNOSIS — Z23 Encounter for immunization: Secondary | ICD-10-CM | POA: Diagnosis not present

## 2022-10-05 DIAGNOSIS — E7800 Pure hypercholesterolemia, unspecified: Secondary | ICD-10-CM | POA: Diagnosis not present

## 2022-10-05 DIAGNOSIS — M810 Age-related osteoporosis without current pathological fracture: Secondary | ICD-10-CM | POA: Diagnosis not present

## 2022-10-05 DIAGNOSIS — F5101 Primary insomnia: Secondary | ICD-10-CM | POA: Diagnosis not present

## 2022-10-05 DIAGNOSIS — I700 Atherosclerosis of aorta: Secondary | ICD-10-CM | POA: Diagnosis not present

## 2022-10-05 DIAGNOSIS — F419 Anxiety disorder, unspecified: Secondary | ICD-10-CM | POA: Diagnosis not present

## 2022-10-05 DIAGNOSIS — B009 Herpesviral infection, unspecified: Secondary | ICD-10-CM | POA: Diagnosis not present

## 2022-10-06 ENCOUNTER — Other Ambulatory Visit: Payer: Self-pay | Admitting: Cardiovascular Disease

## 2022-10-22 DIAGNOSIS — I10 Essential (primary) hypertension: Secondary | ICD-10-CM | POA: Diagnosis not present

## 2022-10-22 DIAGNOSIS — S022XXA Fracture of nasal bones, initial encounter for closed fracture: Secondary | ICD-10-CM | POA: Diagnosis not present

## 2022-10-22 DIAGNOSIS — S0990XA Unspecified injury of head, initial encounter: Secondary | ICD-10-CM | POA: Diagnosis not present

## 2022-10-22 DIAGNOSIS — M542 Cervicalgia: Secondary | ICD-10-CM | POA: Diagnosis not present

## 2022-10-22 DIAGNOSIS — S0081XA Abrasion of other part of head, initial encounter: Secondary | ICD-10-CM | POA: Diagnosis not present

## 2022-10-22 DIAGNOSIS — W01198A Fall on same level from slipping, tripping and stumbling with subsequent striking against other object, initial encounter: Secondary | ICD-10-CM | POA: Diagnosis not present

## 2022-10-22 DIAGNOSIS — S51811A Laceration without foreign body of right forearm, initial encounter: Secondary | ICD-10-CM | POA: Diagnosis not present

## 2022-10-25 DIAGNOSIS — S022XXD Fracture of nasal bones, subsequent encounter for fracture with routine healing: Secondary | ICD-10-CM | POA: Diagnosis not present

## 2022-10-25 DIAGNOSIS — Z9181 History of falling: Secondary | ICD-10-CM | POA: Diagnosis not present

## 2022-10-25 DIAGNOSIS — S41111A Laceration without foreign body of right upper arm, initial encounter: Secondary | ICD-10-CM | POA: Diagnosis not present

## 2022-12-15 ENCOUNTER — Other Ambulatory Visit: Payer: Self-pay | Admitting: Cardiovascular Disease

## 2023-01-17 ENCOUNTER — Other Ambulatory Visit (HOSPITAL_COMMUNITY): Payer: Self-pay

## 2023-01-18 ENCOUNTER — Other Ambulatory Visit (HOSPITAL_COMMUNITY): Payer: Self-pay

## 2023-01-19 ENCOUNTER — Other Ambulatory Visit (HOSPITAL_COMMUNITY): Payer: Self-pay

## 2023-01-19 MED ORDER — VALACYCLOVIR HCL 1 G PO TABS: 2.0000 g | ORAL_TABLET | Freq: Two times a day (BID) | ORAL | 2 refills | Status: AC

## 2023-01-19 MED ORDER — SODIUM CHLORIDE (HYPERTONIC) 5 % OP SOLN
1.0000 [drp] | Freq: Four times a day (QID) | OPHTHALMIC | 11 refills | Status: DC | PRN
  Filled 2023-02-20: qty 15, 75d supply, fill #0
  Filled 2023-04-06: qty 15, 75d supply, fill #1
  Filled 2023-05-10 – 2023-05-23 (×2): qty 15, 75d supply, fill #2
  Filled 2023-06-21: qty 15, 75d supply, fill #3
  Filled 2023-08-01: qty 15, 75d supply, fill #4
  Filled 2023-09-06: qty 15, 75d supply, fill #5

## 2023-01-30 ENCOUNTER — Other Ambulatory Visit (HOSPITAL_COMMUNITY): Payer: Self-pay

## 2023-01-30 ENCOUNTER — Other Ambulatory Visit: Payer: Self-pay

## 2023-01-30 MED FILL — Atorvastatin Calcium Tab 40 MG (Base Equivalent): ORAL | 90 days supply | Qty: 90 | Fill #0 | Status: AC

## 2023-01-30 MED FILL — Valsartan Tab 80 MG: ORAL | 30 days supply | Qty: 30 | Fill #0 | Status: AC

## 2023-02-20 ENCOUNTER — Other Ambulatory Visit: Payer: Self-pay

## 2023-02-20 ENCOUNTER — Other Ambulatory Visit (HOSPITAL_COMMUNITY): Payer: Self-pay

## 2023-02-21 ENCOUNTER — Other Ambulatory Visit: Payer: Self-pay

## 2023-02-28 ENCOUNTER — Other Ambulatory Visit: Payer: Self-pay

## 2023-02-28 ENCOUNTER — Other Ambulatory Visit (HOSPITAL_COMMUNITY): Payer: Self-pay

## 2023-02-28 MED ORDER — BUSPIRONE HCL 10 MG PO TABS
10.0000 mg | ORAL_TABLET | Freq: Two times a day (BID) | ORAL | 2 refills | Status: DC
  Filled 2023-02-28: qty 180, 90d supply, fill #0

## 2023-03-28 ENCOUNTER — Other Ambulatory Visit (HOSPITAL_BASED_OUTPATIENT_CLINIC_OR_DEPARTMENT_OTHER): Payer: Self-pay

## 2023-04-05 ENCOUNTER — Other Ambulatory Visit (HOSPITAL_COMMUNITY): Payer: Self-pay

## 2023-04-05 ENCOUNTER — Other Ambulatory Visit: Payer: Self-pay

## 2023-04-05 DIAGNOSIS — F411 Generalized anxiety disorder: Secondary | ICD-10-CM | POA: Diagnosis not present

## 2023-04-05 DIAGNOSIS — F5101 Primary insomnia: Secondary | ICD-10-CM | POA: Diagnosis not present

## 2023-04-05 DIAGNOSIS — L989 Disorder of the skin and subcutaneous tissue, unspecified: Secondary | ICD-10-CM | POA: Diagnosis not present

## 2023-04-05 DIAGNOSIS — I10 Essential (primary) hypertension: Secondary | ICD-10-CM | POA: Diagnosis not present

## 2023-04-05 DIAGNOSIS — M810 Age-related osteoporosis without current pathological fracture: Secondary | ICD-10-CM | POA: Diagnosis not present

## 2023-04-05 MED ORDER — TRAZODONE HCL 50 MG PO TABS
25.0000 mg | ORAL_TABLET | Freq: Every evening | ORAL | 0 refills | Status: DC | PRN
  Filled 2023-04-05: qty 90, 90d supply, fill #0

## 2023-04-06 ENCOUNTER — Other Ambulatory Visit: Payer: Self-pay

## 2023-04-06 ENCOUNTER — Other Ambulatory Visit (HOSPITAL_COMMUNITY): Payer: Self-pay

## 2023-04-06 MED FILL — Valsartan Tab 80 MG: ORAL | 30 days supply | Qty: 30 | Fill #1 | Status: AC

## 2023-04-07 ENCOUNTER — Other Ambulatory Visit (HOSPITAL_COMMUNITY): Payer: Self-pay

## 2023-04-25 DIAGNOSIS — H903 Sensorineural hearing loss, bilateral: Secondary | ICD-10-CM | POA: Diagnosis not present

## 2023-04-25 DIAGNOSIS — H938X3 Other specified disorders of ear, bilateral: Secondary | ICD-10-CM | POA: Diagnosis not present

## 2023-04-25 DIAGNOSIS — H90A32 Mixed conductive and sensorineural hearing loss, unilateral, left ear with restricted hearing on the contralateral side: Secondary | ICD-10-CM | POA: Diagnosis not present

## 2023-04-27 ENCOUNTER — Other Ambulatory Visit (HOSPITAL_COMMUNITY): Payer: Self-pay

## 2023-04-27 ENCOUNTER — Other Ambulatory Visit: Payer: Self-pay

## 2023-04-27 MED ORDER — ALPRAZOLAM 0.5 MG PO TABS
0.2500 mg | ORAL_TABLET | Freq: Three times a day (TID) | ORAL | 0 refills | Status: DC | PRN
  Filled 2023-04-27: qty 10, 4d supply, fill #0

## 2023-04-29 ENCOUNTER — Other Ambulatory Visit (HOSPITAL_COMMUNITY): Payer: Self-pay

## 2023-05-10 ENCOUNTER — Telehealth: Payer: Self-pay | Admitting: Cardiovascular Disease

## 2023-05-10 ENCOUNTER — Other Ambulatory Visit: Payer: Self-pay | Admitting: Cardiovascular Disease

## 2023-05-10 ENCOUNTER — Other Ambulatory Visit: Payer: Self-pay

## 2023-05-10 ENCOUNTER — Other Ambulatory Visit (HOSPITAL_COMMUNITY): Payer: Self-pay

## 2023-05-10 MED ORDER — VALSARTAN 80 MG PO TABS
80.0000 mg | ORAL_TABLET | Freq: Every day | ORAL | 4 refills | Status: DC
  Filled 2023-05-10: qty 90, 90d supply, fill #0

## 2023-05-10 MED ORDER — VALSARTAN 80 MG PO TABS
80.0000 mg | ORAL_TABLET | Freq: Every day | ORAL | 1 refills | Status: DC
  Filled 2023-05-10: qty 90, 90d supply, fill #0
  Filled 2023-07-17 – 2023-08-01 (×2): qty 90, 90d supply, fill #1

## 2023-05-10 MED FILL — Valsartan Tab 80 MG: ORAL | 30 days supply | Qty: 30 | Fill #2 | Status: CN

## 2023-05-10 MED FILL — Atorvastatin Calcium Tab 40 MG (Base Equivalent): ORAL | 90 days supply | Qty: 90 | Fill #1 | Status: AC

## 2023-05-10 NOTE — Telephone Encounter (Signed)
Pt's medication was resent to pt's pharmacy for a 90 day supply as requested. Confirmation received.  

## 2023-05-10 NOTE — Telephone Encounter (Signed)
*  STAT* If patient is at the pharmacy, call can be transferred to refill team.   1. Which medications need to be refilled? (please list name of each medication and dose if known)  valsartan  (DIOVAN ) 80 MG tablet  2. Which pharmacy/location (including street and city if local pharmacy) is medication to be sent to? [facility]  3. Do they need a 30 day or 90 day supply?  90 day supply - [name] with [facility] says they received a request for a 30 day supply, but patient was requesting 90 days. [name] says she sent a request for a 90 day supply, but received another prescription for 30 days. Please advise.

## 2023-05-23 ENCOUNTER — Other Ambulatory Visit (HOSPITAL_COMMUNITY): Payer: Self-pay

## 2023-05-25 ENCOUNTER — Telehealth: Payer: Self-pay | Admitting: Cardiovascular Disease

## 2023-05-25 NOTE — Telephone Encounter (Signed)
 Pt c/o BP issue: STAT if pt c/o blurred vision, one-sided weakness or slurred speech.  STAT if BP is GREATER than 180/120 TODAY.  STAT if BP is LESS than 90/60 and SYMPTOMATIC TODAY  1. What is your BP concern?   Patient is concerned about her high BP readings  2. Have you taken any BP medication today?  Yes  3. What are your last 5 BP readings?  Not available  4. Are you having any other symptoms (ex. Dizziness, headache, blurred vision, passed out)?   No  Patient stated she is concerned her BP readings have been in the 180's.

## 2023-05-25 NOTE — Telephone Encounter (Signed)
 Spoke with patient and she rechecked BP and its currently she also took her xanax  this am 122/69. She states she feels better and will try not to work herself up.

## 2023-05-25 NOTE — Telephone Encounter (Signed)
 Spoke with patient and she states for the last week or so her BP has been elevated 188/76. She can not give me her HR or any other readings. She states she is worried about her BP. She just took her BP medicine and is very anxious. Did inform patient that her anxiety can increase her BP. Advised to try and relax. She states she will take her xanax . We will recheck BP in 2 hours. I will cal her back for an update.

## 2023-05-30 ENCOUNTER — Other Ambulatory Visit: Payer: Self-pay

## 2023-05-30 ENCOUNTER — Other Ambulatory Visit (HOSPITAL_COMMUNITY): Payer: Self-pay

## 2023-05-30 DIAGNOSIS — I10 Essential (primary) hypertension: Secondary | ICD-10-CM | POA: Diagnosis not present

## 2023-05-30 DIAGNOSIS — F411 Generalized anxiety disorder: Secondary | ICD-10-CM | POA: Diagnosis not present

## 2023-05-30 DIAGNOSIS — H6121 Impacted cerumen, right ear: Secondary | ICD-10-CM | POA: Diagnosis not present

## 2023-05-30 MED ORDER — MIRTAZAPINE 15 MG PO TABS
15.0000 mg | ORAL_TABLET | Freq: Every day | ORAL | 0 refills | Status: DC
  Filled 2023-05-30: qty 90, 90d supply, fill #0

## 2023-05-30 MED ORDER — METOPROLOL SUCCINATE ER 25 MG PO TB24
25.0000 mg | ORAL_TABLET | Freq: Every day | ORAL | 0 refills | Status: DC
  Filled 2023-05-30: qty 90, 90d supply, fill #0

## 2023-05-31 ENCOUNTER — Other Ambulatory Visit (HOSPITAL_COMMUNITY): Payer: Self-pay

## 2023-06-03 DIAGNOSIS — F411 Generalized anxiety disorder: Secondary | ICD-10-CM | POA: Diagnosis not present

## 2023-06-03 DIAGNOSIS — I10 Essential (primary) hypertension: Secondary | ICD-10-CM | POA: Diagnosis not present

## 2023-06-03 DIAGNOSIS — E7800 Pure hypercholesterolemia, unspecified: Secondary | ICD-10-CM | POA: Diagnosis not present

## 2023-06-03 DIAGNOSIS — M810 Age-related osteoporosis without current pathological fracture: Secondary | ICD-10-CM | POA: Diagnosis not present

## 2023-06-12 ENCOUNTER — Other Ambulatory Visit (HOSPITAL_BASED_OUTPATIENT_CLINIC_OR_DEPARTMENT_OTHER): Payer: Self-pay

## 2023-06-21 ENCOUNTER — Other Ambulatory Visit (HOSPITAL_COMMUNITY): Payer: Self-pay

## 2023-06-21 ENCOUNTER — Other Ambulatory Visit: Payer: Self-pay

## 2023-06-26 ENCOUNTER — Other Ambulatory Visit (HOSPITAL_COMMUNITY): Payer: Self-pay

## 2023-06-26 DIAGNOSIS — H60313 Diffuse otitis externa, bilateral: Secondary | ICD-10-CM | POA: Diagnosis not present

## 2023-06-26 MED ORDER — NEOMYCIN-POLYMYXIN-HC 1 % OT SOLN
4.0000 [drp] | Freq: Two times a day (BID) | OTIC | 0 refills | Status: AC
  Filled 2023-06-26 (×2): qty 10, 25d supply, fill #0

## 2023-06-27 ENCOUNTER — Other Ambulatory Visit: Payer: Self-pay

## 2023-06-27 ENCOUNTER — Other Ambulatory Visit (HOSPITAL_COMMUNITY): Payer: Self-pay

## 2023-06-28 ENCOUNTER — Other Ambulatory Visit (HOSPITAL_COMMUNITY): Payer: Self-pay

## 2023-06-28 ENCOUNTER — Other Ambulatory Visit: Payer: Self-pay

## 2023-06-28 MED ORDER — ALPRAZOLAM 0.5 MG PO TABS
0.2500 mg | ORAL_TABLET | Freq: Three times a day (TID) | ORAL | 0 refills | Status: DC | PRN
  Filled 2023-06-28: qty 10, 4d supply, fill #0

## 2023-07-03 DIAGNOSIS — E7800 Pure hypercholesterolemia, unspecified: Secondary | ICD-10-CM | POA: Diagnosis not present

## 2023-07-03 DIAGNOSIS — I10 Essential (primary) hypertension: Secondary | ICD-10-CM | POA: Diagnosis not present

## 2023-07-03 DIAGNOSIS — F411 Generalized anxiety disorder: Secondary | ICD-10-CM | POA: Diagnosis not present

## 2023-07-03 DIAGNOSIS — M810 Age-related osteoporosis without current pathological fracture: Secondary | ICD-10-CM | POA: Diagnosis not present

## 2023-07-13 DIAGNOSIS — H18513 Endothelial corneal dystrophy, bilateral: Secondary | ICD-10-CM | POA: Diagnosis not present

## 2023-07-13 DIAGNOSIS — H52203 Unspecified astigmatism, bilateral: Secondary | ICD-10-CM | POA: Diagnosis not present

## 2023-07-13 DIAGNOSIS — Z961 Presence of intraocular lens: Secondary | ICD-10-CM | POA: Diagnosis not present

## 2023-07-17 ENCOUNTER — Other Ambulatory Visit (HOSPITAL_COMMUNITY): Payer: Self-pay

## 2023-07-31 DIAGNOSIS — H60313 Diffuse otitis externa, bilateral: Secondary | ICD-10-CM | POA: Diagnosis not present

## 2023-08-01 ENCOUNTER — Other Ambulatory Visit (HOSPITAL_COMMUNITY): Payer: Self-pay

## 2023-08-01 ENCOUNTER — Other Ambulatory Visit: Payer: Self-pay

## 2023-08-01 MED ORDER — CLOTRIMAZOLE 1 % EX SOLN
1.0000 | Freq: Two times a day (BID) | CUTANEOUS | 2 refills | Status: AC
  Filled 2023-08-01: qty 30, 15d supply, fill #0

## 2023-08-02 ENCOUNTER — Other Ambulatory Visit (HOSPITAL_COMMUNITY): Payer: Self-pay

## 2023-08-02 ENCOUNTER — Other Ambulatory Visit (HOSPITAL_BASED_OUTPATIENT_CLINIC_OR_DEPARTMENT_OTHER): Payer: Self-pay

## 2023-08-03 DIAGNOSIS — E7800 Pure hypercholesterolemia, unspecified: Secondary | ICD-10-CM | POA: Diagnosis not present

## 2023-08-03 DIAGNOSIS — I10 Essential (primary) hypertension: Secondary | ICD-10-CM | POA: Diagnosis not present

## 2023-08-03 DIAGNOSIS — F411 Generalized anxiety disorder: Secondary | ICD-10-CM | POA: Diagnosis not present

## 2023-08-03 DIAGNOSIS — M810 Age-related osteoporosis without current pathological fracture: Secondary | ICD-10-CM | POA: Diagnosis not present

## 2023-08-07 ENCOUNTER — Telehealth: Payer: Self-pay | Admitting: Nurse Practitioner

## 2023-08-07 ENCOUNTER — Other Ambulatory Visit (HOSPITAL_COMMUNITY): Payer: Self-pay

## 2023-08-07 ENCOUNTER — Other Ambulatory Visit: Payer: Self-pay

## 2023-08-07 MED ORDER — METOPROLOL SUCCINATE ER 25 MG PO TB24
25.0000 mg | ORAL_TABLET | Freq: Every day | ORAL | 0 refills | Status: DC
  Filled 2023-08-07: qty 90, 90d supply, fill #0

## 2023-08-07 NOTE — Telephone Encounter (Signed)
 *  STAT* If patient is at the pharmacy, call can be transferred to refill team.   1. Which medications need to be refilled? (please list name of each medication and dose if known)   atorvastatin  (LIPITOR) 40 MG tablet   2. Which pharmacy/location (including street and city if local pharmacy) is medication to be sent to?  [facility]   3. Do they need a 30 day or 90 day supply?  90 day

## 2023-08-08 ENCOUNTER — Other Ambulatory Visit: Payer: Self-pay

## 2023-08-08 ENCOUNTER — Other Ambulatory Visit (HOSPITAL_COMMUNITY): Payer: Self-pay

## 2023-08-08 MED ORDER — ATORVASTATIN CALCIUM 40 MG PO TABS
40.0000 mg | ORAL_TABLET | Freq: Every day | ORAL | 0 refills | Status: DC
  Filled 2023-08-08: qty 45, 45d supply, fill #0

## 2023-08-08 MED ORDER — MIRTAZAPINE 15 MG PO TABS
15.0000 mg | ORAL_TABLET | Freq: Every day | ORAL | 0 refills | Status: DC
  Filled 2023-08-25: qty 90, 90d supply, fill #0

## 2023-08-08 NOTE — Telephone Encounter (Signed)
 Pt has enough until year is up.

## 2023-08-16 ENCOUNTER — Telehealth: Payer: Self-pay | Admitting: Nurse Practitioner

## 2023-08-16 ENCOUNTER — Encounter: Payer: Self-pay | Admitting: Cardiovascular Disease

## 2023-08-16 NOTE — Telephone Encounter (Signed)
 Spoke with pt who said her BP this morning was in the 170s systolic when she got up. She took her Metoprolol  and Valsartan  and the last BP reading she has is her systolic in the 130s. Pt states her BP being high makes her very anxious. Explained to pt that anxiety can also raise BP. Pt stated she had been told a couple of weeks ago to take her PRN Xanax  and take her BP reading a couple of hours after and her BP did lower. Explained to pt that I would forward this message to Damien Braver, NP for further recommendations. Pt scheduled to see Damien on 9/5.

## 2023-08-16 NOTE — Telephone Encounter (Signed)
 Pt c/o BP issue: STAT if pt c/o blurred vision, one-sided weakness or slurred speech.  STAT if BP is GREATER than 180/120 TODAY.  STAT if BP is LESS than 90/60 and SYMPTOMATIC TODAY  1. What is your BP concern? Pt concerned her bp is high   2. Have you taken any BP medication today? Yes (took valsartan  and metoprolol )   3. What are your last 5 BP readings?  All readings were taken before medication; she states her nurse told her to take as soon as she gets up. 175/84  164/72 145/? (Does not remember bottom number)   4. Are you having any other symptoms (ex. Dizziness, headache, blurred vision, passed out)? No

## 2023-08-16 NOTE — Telephone Encounter (Signed)
 Spoke with pt and explained Emily's response. Recommended pt to discuss with PCP to see if it would be possible to begin an anti-anxiety medication on a daily basis to help with BP readings since multiple times in the past, she has been recommended to take a Xanax  to help with anxiety to lower BP and it has worked. Advised pt, as Damien stated, to report any BP readings consistently greater than 140/80 and to continue to keep a BP log. Pt verbalized understanding of plan and had no further questions at this time.

## 2023-08-16 NOTE — Telephone Encounter (Signed)
 Error

## 2023-08-23 ENCOUNTER — Other Ambulatory Visit (HOSPITAL_COMMUNITY): Payer: Self-pay

## 2023-08-23 DIAGNOSIS — I10 Essential (primary) hypertension: Secondary | ICD-10-CM | POA: Diagnosis not present

## 2023-08-23 DIAGNOSIS — F411 Generalized anxiety disorder: Secondary | ICD-10-CM | POA: Diagnosis not present

## 2023-08-23 MED ORDER — ALPRAZOLAM 0.5 MG PO TABS
0.2500 mg | ORAL_TABLET | Freq: Three times a day (TID) | ORAL | 0 refills | Status: DC | PRN
  Filled 2023-08-23: qty 10, 4d supply, fill #0

## 2023-08-25 ENCOUNTER — Other Ambulatory Visit (HOSPITAL_COMMUNITY): Payer: Self-pay

## 2023-08-29 ENCOUNTER — Other Ambulatory Visit (HOSPITAL_COMMUNITY): Payer: Self-pay

## 2023-08-30 DIAGNOSIS — M7989 Other specified soft tissue disorders: Secondary | ICD-10-CM | POA: Diagnosis not present

## 2023-08-30 DIAGNOSIS — I10 Essential (primary) hypertension: Secondary | ICD-10-CM | POA: Diagnosis not present

## 2023-08-30 DIAGNOSIS — Z23 Encounter for immunization: Secondary | ICD-10-CM | POA: Diagnosis not present

## 2023-09-03 DIAGNOSIS — M810 Age-related osteoporosis without current pathological fracture: Secondary | ICD-10-CM | POA: Diagnosis not present

## 2023-09-03 DIAGNOSIS — I10 Essential (primary) hypertension: Secondary | ICD-10-CM | POA: Diagnosis not present

## 2023-09-03 DIAGNOSIS — E7800 Pure hypercholesterolemia, unspecified: Secondary | ICD-10-CM | POA: Diagnosis not present

## 2023-09-03 DIAGNOSIS — F411 Generalized anxiety disorder: Secondary | ICD-10-CM | POA: Diagnosis not present

## 2023-09-06 ENCOUNTER — Other Ambulatory Visit (HOSPITAL_COMMUNITY): Payer: Self-pay

## 2023-09-07 ENCOUNTER — Ambulatory Visit: Payer: Self-pay | Admitting: Nurse Practitioner

## 2023-09-08 ENCOUNTER — Ambulatory Visit: Payer: Self-pay | Admitting: Nurse Practitioner

## 2023-09-19 ENCOUNTER — Other Ambulatory Visit: Payer: Self-pay

## 2023-09-19 ENCOUNTER — Other Ambulatory Visit: Payer: Self-pay | Admitting: Nurse Practitioner

## 2023-09-19 ENCOUNTER — Other Ambulatory Visit (HOSPITAL_COMMUNITY): Payer: Self-pay

## 2023-09-19 MED ORDER — ATORVASTATIN CALCIUM 40 MG PO TABS
40.0000 mg | ORAL_TABLET | Freq: Every day | ORAL | 0 refills | Status: DC
  Filled 2023-09-19: qty 30, 30d supply, fill #0

## 2023-09-21 DIAGNOSIS — H6123 Impacted cerumen, bilateral: Secondary | ICD-10-CM | POA: Diagnosis not present

## 2023-09-26 DIAGNOSIS — Z1231 Encounter for screening mammogram for malignant neoplasm of breast: Secondary | ICD-10-CM | POA: Diagnosis not present

## 2023-10-03 DIAGNOSIS — E7800 Pure hypercholesterolemia, unspecified: Secondary | ICD-10-CM | POA: Diagnosis not present

## 2023-10-03 DIAGNOSIS — I10 Essential (primary) hypertension: Secondary | ICD-10-CM | POA: Diagnosis not present

## 2023-10-03 DIAGNOSIS — F411 Generalized anxiety disorder: Secondary | ICD-10-CM | POA: Diagnosis not present

## 2023-10-03 DIAGNOSIS — M810 Age-related osteoporosis without current pathological fracture: Secondary | ICD-10-CM | POA: Diagnosis not present

## 2023-10-16 ENCOUNTER — Other Ambulatory Visit (HOSPITAL_COMMUNITY): Payer: Self-pay

## 2023-10-16 ENCOUNTER — Other Ambulatory Visit: Payer: Self-pay

## 2023-10-16 ENCOUNTER — Other Ambulatory Visit: Payer: Self-pay | Admitting: Nurse Practitioner

## 2023-10-16 MED ORDER — SODIUM CHLORIDE (HYPERTONIC) 5 % OP SOLN
1.0000 [drp] | Freq: Four times a day (QID) | OPHTHALMIC | 11 refills | Status: AC | PRN
  Filled 2023-10-16: qty 15, 75d supply, fill #0
  Filled 2023-11-14: qty 30, 150d supply, fill #1
  Filled 2024-01-12: qty 15, 75d supply, fill #2

## 2023-10-16 NOTE — Progress Notes (Unsigned)
 Cardiology Office Note:  .   Date:  10/17/2023  ID:  [name], DOB 1930-05-09, MRN [id number] PCP: Chrystal Lamarr RAMAN, MD  [facility] Providers Cardiologist:  Darryle ONEIDA Decent, MD NEW (needs to establish care)   History of Present Illness: Heather   [name] is a 89 y.o. female  with PMHx of CAD s/p PTCA and cutting balloon to D1 (Lexiscan  2017: normal), HLD, HTN, Carotid Artery Stenosis s/p right carotid endarterectomy (Carotid US  11/2021: Mild RICA stenosis, No stenosis in LICA), cerebral aneurysm s/p post coil embolization in 2008 who reports to [facility] office for follow up.   Last seen in [facility] 09/07/2022 by Dr. Alveta for follow up. EKG showed NSR with RBBB. Doing well from cardiac standpoint w/o any complaints. Continued on ASA 81 mg daily, Lipitor 40 mg daily, Valsartan  80 mg daily,  NTG prn. Follow up labs in 09/2022 for ALT, BMP and FLP (LDL 52) are WNL.   Today, denies any cardiac symptoms including chest pain, shortness of breath, palpitations, syncope, presyncope, dizziness, orthopnea, PND, swelling or significant weight changes, acute bleeding. Reports adherence with medication.  Reports that BP has been elevated up to 160s.  PCP recommended splitting valsartan  80 mg in half and to take 40 mg in the morning and other 40mg  in the afternoon.  Patient reports improvement in SBP to 140s, however patient has been out of town for the past 2 weeks and has not checked blood pressure until yesterday with SBP of 160s.  Patient is able to complete Youtube 15-minute exercises and walk around grocery store without any concerns. Denies tobacco use/alcohol/drug use. Denies any recent hospitalizations or visits to the emergency department.   ROS: 10 point review of system has been reviewed and considered negative except ones been listed in the HPI.   Studies Reviewed: Heather    EKG Interpretation Date/Time:  Tuesday October 17 2023 15:54:09 EDT Ventricular Rate:  72 PR  Interval:  188 QRS Duration:  128 QT Interval:  400 QTC Calculation: 438 R Axis:   -3  Text Interpretation: Normal sinus rhythm Right bundle branch block When compared with ECG of 07-Sep-2022 10:54, Septal infarct is now Present Confirmed by [name] ([id number]) on 10/17/2023 4:10:23 PM   Lexiscan  2017 Normal stress test. No signs of any blood flow issues. Pump function also normal.   Vascular US  11/2021 Summary:  Right Carotid: Velocities in the right ICA are consistent with a 1-39% stenosis.  Stable velocities, s/p history of endarterectomy.  Left Carotid: There is no evidence of stenosis in the left ICA. The extracranial vessels were near-normal with only minimal wall thickening or plaque.  Vertebrals:  Bilateral vertebral arteries demonstrate antegrade flow.  Subclavians: Normal flow hemodynamics were seen in bilateral subclavian arteries.    Physical Exam:   VS:  BP (!) 160/80 (BP Location: Right Arm, Cuff Size: Normal)   Pulse 72   Ht 5' 4 (1.626 m)   Wt 127 lb (57.6 kg)   SpO2 95%   BMI 21.80 kg/m    Wt Readings from Last 3 Encounters:  10/17/23 127 lb (57.6 kg)  09/07/22 126 lb 12.8 oz (57.5 kg)  09/09/21 128 lb 6.4 oz (58.2 kg)    GEN: Well nourished, well developed in no acute distress while sitting in chair.  NECK: No JVD; No carotid bruits CARDIAC: RRR, no murmurs, rubs, gallops RESPIRATORY:  Clear to auscultation without rales, wheezing or rhonchi  ABDOMEN: Soft, non-tender, non-distended EXTREMITIES:  No edema; No deformity   ASSESSMENT AND PLAN: .   Coronary artery disease involving native coronary artery of native heart without angina pectoris Hyperlipidemia LDL goal <70 Lexiscan  2017: normal  Denies any angina symptoms. No need for further ischemic evaluation at this time LDL 61 in 10/2022. K and Cr WNL in 08/2023.  Continue on ASA 81 mg daily, Lipitor 40 mg daily, Valsartan  80 mg daily   Primary hypertension Due to elevated SBP of 160, PCP  recommended splitting valsartan  in half to take 40 mg in am and 40 mg in pm. Patient reports improvement of SBP to 140. However, yesterday SBP back up to 160's.  BP this OV elevated 170/76 repeat 160/80  D/C Toprol  XL and Order Coreg 3.125 BID for better BP control.  Continue on Valsartan  80 mg daily.  Previously d/c amlodipine  due to LE edema in 09/2021.  BP Log for 1 month with BP Goal < 140/80 Discussed proper BP measurement.  Encourage physical activity for 150 minutes per week and heart healthy low sodium diet. Discussed limiting sodium intake to < 2 grams daily.     Bilateral carotid artery stenosis History of right-sided carotid endarterectomy Carotid US  11/2021: Mild RICA stenosis, No stenosis in LICA No bruits on exam. Denies presyncope/syncope.  Continue to monitor.  Will plan to repeat Carotid US  if bruit present or develops associated symptoms.      Dispo:Follow up in 4-6 week with new cardiologist Dr. Barbaraann or any APP.   Signed, Lorette CINDERELLA Kapur, PA-C

## 2023-10-17 ENCOUNTER — Other Ambulatory Visit (HOSPITAL_COMMUNITY): Payer: Self-pay

## 2023-10-17 ENCOUNTER — Encounter: Payer: Self-pay | Admitting: Nurse Practitioner

## 2023-10-17 ENCOUNTER — Ambulatory Visit: Attending: Nurse Practitioner | Admitting: Physician Assistant

## 2023-10-17 VITALS — BP 160/80 | HR 72 | Ht 64.0 in | Wt 127.0 lb

## 2023-10-17 DIAGNOSIS — I2510 Atherosclerotic heart disease of native coronary artery without angina pectoris: Secondary | ICD-10-CM | POA: Diagnosis not present

## 2023-10-17 DIAGNOSIS — E785 Hyperlipidemia, unspecified: Secondary | ICD-10-CM | POA: Diagnosis not present

## 2023-10-17 DIAGNOSIS — I6523 Occlusion and stenosis of bilateral carotid arteries: Secondary | ICD-10-CM

## 2023-10-17 DIAGNOSIS — Z9889 Other specified postprocedural states: Secondary | ICD-10-CM | POA: Diagnosis not present

## 2023-10-17 DIAGNOSIS — I10 Essential (primary) hypertension: Secondary | ICD-10-CM

## 2023-10-17 MED ORDER — CARVEDILOL 3.125 MG PO TABS
3.1250 mg | ORAL_TABLET | Freq: Two times a day (BID) | ORAL | 3 refills | Status: AC
  Filled 2023-10-17: qty 180, 90d supply, fill #0
  Filled 2024-01-12: qty 180, 90d supply, fill #1

## 2023-10-17 NOTE — Patient Instructions (Signed)
 Medication Instructions:  Stop Metoprolol  Succinate 25 mg    Start Carvedilol 3.125 mg twice daily *If you need a refill on your cardiac medications before your next appointment, please call your pharmacy*  Lab Work: NONE ordered at this time of appointment   Testing/Procedures: NONE ordered at this time of appointment   Follow-Up: At [facility], you and your health needs are our priority.  As part of our continuing mission to provide you with exceptional heart care, our providers are all part of one team.  This team includes your primary Cardiologist (physician) and Advanced Practice Providers or APPs (Physician Assistants and Nurse Practitioners) who all work together to provide you with the care you need, when you need it.  Your next appointment:   4-6 week(s)  Provider:   Dr. Barbaraann or Any APP    We recommend signing up for the patient portal called MyChart.  Sign up information is provided on this After Visit Summary.  MyChart is used to connect with patients for Virtual Visits (Telemedicine).  Patients are able to view lab/test results, encounter notes, upcoming appointments, etc.  Non-urgent messages can be sent to your provider as well.   To learn more about what you can do with MyChart, go to [email or web address].   Other Instructions Monitor blood pressure. Goal BP is less than 140/80.

## 2023-10-18 ENCOUNTER — Other Ambulatory Visit: Payer: Self-pay

## 2023-10-18 ENCOUNTER — Other Ambulatory Visit (HOSPITAL_COMMUNITY): Payer: Self-pay

## 2023-10-19 ENCOUNTER — Encounter (HOSPITAL_COMMUNITY): Payer: Self-pay

## 2023-10-19 ENCOUNTER — Other Ambulatory Visit: Payer: Self-pay

## 2023-10-19 ENCOUNTER — Telehealth: Payer: Self-pay | Admitting: Cardiovascular Disease

## 2023-10-19 ENCOUNTER — Other Ambulatory Visit (HOSPITAL_COMMUNITY): Payer: Self-pay

## 2023-10-19 DIAGNOSIS — F419 Anxiety disorder, unspecified: Secondary | ICD-10-CM | POA: Diagnosis not present

## 2023-10-19 DIAGNOSIS — E538 Deficiency of other specified B group vitamins: Secondary | ICD-10-CM | POA: Diagnosis not present

## 2023-10-19 DIAGNOSIS — Z23 Encounter for immunization: Secondary | ICD-10-CM | POA: Diagnosis not present

## 2023-10-19 DIAGNOSIS — Z79899 Other long term (current) drug therapy: Secondary | ICD-10-CM | POA: Diagnosis not present

## 2023-10-19 DIAGNOSIS — E7800 Pure hypercholesterolemia, unspecified: Secondary | ICD-10-CM | POA: Diagnosis not present

## 2023-10-19 DIAGNOSIS — B009 Herpesviral infection, unspecified: Secondary | ICD-10-CM | POA: Diagnosis not present

## 2023-10-19 DIAGNOSIS — F5101 Primary insomnia: Secondary | ICD-10-CM | POA: Diagnosis not present

## 2023-10-19 DIAGNOSIS — Z0000 Encounter for general adult medical examination without abnormal findings: Secondary | ICD-10-CM | POA: Diagnosis not present

## 2023-10-19 DIAGNOSIS — M810 Age-related osteoporosis without current pathological fracture: Secondary | ICD-10-CM | POA: Diagnosis not present

## 2023-10-19 DIAGNOSIS — I10 Essential (primary) hypertension: Secondary | ICD-10-CM | POA: Diagnosis not present

## 2023-10-19 MED ORDER — ATORVASTATIN CALCIUM 40 MG PO TABS
40.0000 mg | ORAL_TABLET | Freq: Every day | ORAL | 3 refills | Status: AC
  Filled 2023-10-19: qty 90, 90d supply, fill #0

## 2023-10-19 NOTE — Telephone Encounter (Signed)
 Pt's medication was sent to pt's pharmacy as requested. Confirmation received.

## 2023-10-19 NOTE — Telephone Encounter (Signed)
*  STAT* If patient is at the pharmacy, call can be transferred to refill team.   1. Which medications need to be refilled? (please list name of each medication and dose if known)  atorvastatin  (LIPITOR) 40 MG tablet  2. Which pharmacy/location (including street and city if local pharmacy) is medication to be sent to? [facility]   3. Do they need a 30 day or 90 day supply?  90 day supply

## 2023-10-20 ENCOUNTER — Other Ambulatory Visit (HOSPITAL_COMMUNITY): Payer: Self-pay

## 2023-11-02 ENCOUNTER — Other Ambulatory Visit (HOSPITAL_COMMUNITY): Payer: Self-pay

## 2023-11-02 ENCOUNTER — Other Ambulatory Visit: Payer: Self-pay

## 2023-11-03 ENCOUNTER — Other Ambulatory Visit: Payer: Self-pay | Admitting: Nurse Practitioner

## 2023-11-03 ENCOUNTER — Other Ambulatory Visit (HOSPITAL_COMMUNITY): Payer: Self-pay

## 2023-11-03 DIAGNOSIS — F411 Generalized anxiety disorder: Secondary | ICD-10-CM | POA: Diagnosis not present

## 2023-11-03 DIAGNOSIS — I10 Essential (primary) hypertension: Secondary | ICD-10-CM | POA: Diagnosis not present

## 2023-11-03 DIAGNOSIS — E7800 Pure hypercholesterolemia, unspecified: Secondary | ICD-10-CM | POA: Diagnosis not present

## 2023-11-03 DIAGNOSIS — M810 Age-related osteoporosis without current pathological fracture: Secondary | ICD-10-CM | POA: Diagnosis not present

## 2023-11-03 MED FILL — Valsartan Tab 80 MG: ORAL | 90 days supply | Qty: 90 | Fill #0 | Status: AC

## 2023-11-04 ENCOUNTER — Other Ambulatory Visit (HOSPITAL_COMMUNITY): Payer: Self-pay

## 2023-11-06 ENCOUNTER — Other Ambulatory Visit: Payer: Self-pay

## 2023-11-14 ENCOUNTER — Other Ambulatory Visit: Payer: Self-pay

## 2023-11-14 ENCOUNTER — Other Ambulatory Visit (HOSPITAL_COMMUNITY): Payer: Self-pay

## 2023-11-14 MED ORDER — MIRTAZAPINE 15 MG PO TABS
15.0000 mg | ORAL_TABLET | Freq: Every day | ORAL | 0 refills | Status: AC
  Filled 2023-11-14: qty 90, 90d supply, fill #0

## 2023-11-15 DIAGNOSIS — H6123 Impacted cerumen, bilateral: Secondary | ICD-10-CM | POA: Diagnosis not present

## 2023-12-03 DIAGNOSIS — F411 Generalized anxiety disorder: Secondary | ICD-10-CM | POA: Diagnosis not present

## 2023-12-03 DIAGNOSIS — I10 Essential (primary) hypertension: Secondary | ICD-10-CM | POA: Diagnosis not present

## 2023-12-03 DIAGNOSIS — M810 Age-related osteoporosis without current pathological fracture: Secondary | ICD-10-CM | POA: Diagnosis not present

## 2023-12-03 DIAGNOSIS — E7800 Pure hypercholesterolemia, unspecified: Secondary | ICD-10-CM | POA: Diagnosis not present

## 2023-12-07 NOTE — Progress Notes (Deleted)
  Cardiology Office Note:  .   Date:  12/07/2023  ID:  [name], DOB May 20, 1930, MRN [id number] PCP: Chrystal Lamarr RAMAN, MD  [facility] Providers Cardiologist:  Darryle ONEIDA Decent, MD   History of Present Illness: .   No chief complaint on file.   [name] is a 89 y.o. female with below history who presents for follow-up.   Discussed the use of AI scribe software for clinical note transcription with the patient, who gave verbal consent to proceed.  History of Present Illness               Problem List CAD -POBA D1 2. Carotid artery disease  -R CEA 3. Cerebral aneurysm s/p coiling  4. RBBB 5. HTN 6. HLD    ROS: All other ROS reviewed and negative. Pertinent positives noted in the HPI.     Studies Reviewed: SABRA       Physical Exam:   VS:  There were no vitals taken for this visit.   Wt Readings from Last 3 Encounters:  10/17/23 127 lb (57.6 kg)  09/07/22 126 lb 12.8 oz (57.5 kg)  09/09/21 128 lb 6.4 oz (58.2 kg)    GEN: Well nourished, well developed in no acute distress NECK: No JVD; No carotid bruits CARDIAC: ***RRR, no murmurs, rubs, gallops RESPIRATORY:  Clear to auscultation without rales, wheezing or rhonchi  ABDOMEN: Soft, non-tender, non-distended EXTREMITIES:  No edema; No deformity  ASSESSMENT AND PLAN: .   Assessment and Plan                 {Are you ordering a CV Procedure (e.g. stress test, cath, DCCV, TEE, etc)?   Press F2        :[id number]}   Follow-up: No follow-ups on file.  Signed, Darryle ONEIDA. Decent, MD, [facility]  [facility]  [address] [place], [place] [address] [phone]  9:24 PM

## 2023-12-08 ENCOUNTER — Ambulatory Visit: Admitting: Cardiovascular Disease

## 2023-12-08 DIAGNOSIS — E785 Hyperlipidemia, unspecified: Secondary | ICD-10-CM

## 2023-12-08 DIAGNOSIS — I2510 Atherosclerotic heart disease of native coronary artery without angina pectoris: Secondary | ICD-10-CM

## 2023-12-08 DIAGNOSIS — I10 Essential (primary) hypertension: Secondary | ICD-10-CM

## 2023-12-08 DIAGNOSIS — I6523 Occlusion and stenosis of bilateral carotid arteries: Secondary | ICD-10-CM

## 2024-01-12 ENCOUNTER — Other Ambulatory Visit: Payer: Self-pay

## 2024-01-12 ENCOUNTER — Other Ambulatory Visit (HOSPITAL_COMMUNITY): Payer: Self-pay

## 2024-01-15 ENCOUNTER — Other Ambulatory Visit: Payer: Self-pay

## 2024-01-17 ENCOUNTER — Other Ambulatory Visit (HOSPITAL_COMMUNITY): Payer: Self-pay

## 2024-01-17 ENCOUNTER — Other Ambulatory Visit: Payer: Self-pay

## 2024-01-17 ENCOUNTER — Encounter: Payer: Self-pay | Admitting: Pharmacist

## 2024-01-19 ENCOUNTER — Other Ambulatory Visit (HOSPITAL_COMMUNITY): Payer: Self-pay

## 2024-01-29 ENCOUNTER — Other Ambulatory Visit (HOSPITAL_COMMUNITY): Payer: Self-pay

## 2024-01-29 MED FILL — Valsartan Tab 80 MG: ORAL | 90 days supply | Qty: 90 | Fill #1 | Status: AC

## 2024-01-30 ENCOUNTER — Other Ambulatory Visit (HOSPITAL_COMMUNITY): Payer: Self-pay

## 2024-01-30 MED ORDER — ALPRAZOLAM 0.5 MG PO TABS
0.2500 mg | ORAL_TABLET | Freq: Three times a day (TID) | ORAL | 0 refills | Status: AC | PRN
  Filled 2024-01-30: qty 10, 4d supply, fill #0

## 2024-01-31 ENCOUNTER — Other Ambulatory Visit: Payer: Self-pay
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
## 2024-02-05 ENCOUNTER — Ambulatory Visit: Admitting: Cardiovascular Disease

## 2024-02-06 ENCOUNTER — Ambulatory Visit (HOSPITAL_BASED_OUTPATIENT_CLINIC_OR_DEPARTMENT_OTHER): Admitting: Cardiovascular Disease

## 2024-02-06 ENCOUNTER — Encounter (HOSPITAL_BASED_OUTPATIENT_CLINIC_OR_DEPARTMENT_OTHER): Payer: Self-pay | Admitting: Cardiovascular Disease

## 2024-02-06 VITALS — BP 148/68 | HR 54 | Ht 64.0 in | Wt 126.5 lb

## 2024-02-06 DIAGNOSIS — E785 Hyperlipidemia, unspecified: Secondary | ICD-10-CM | POA: Diagnosis not present

## 2024-02-06 DIAGNOSIS — I10 Essential (primary) hypertension: Secondary | ICD-10-CM | POA: Diagnosis not present

## 2024-02-06 DIAGNOSIS — I6523 Occlusion and stenosis of bilateral carotid arteries: Secondary | ICD-10-CM | POA: Diagnosis not present

## 2024-02-06 DIAGNOSIS — I2510 Atherosclerotic heart disease of native coronary artery without angina pectoris: Secondary | ICD-10-CM | POA: Diagnosis not present
